# Patient Record
Sex: Female | Born: 1960 | Race: White | Hispanic: No | Marital: Married | Smoking: Current every day smoker
Health system: Southern US, Community
[De-identification: ages and names within clinical notes are randomized; demographics above are authoritative.]

## PROBLEM LIST (undated history)

## (undated) DIAGNOSIS — K835 Biliary cyst: Secondary | ICD-10-CM

## (undated) DIAGNOSIS — F329 Major depressive disorder, single episode, unspecified: Secondary | ICD-10-CM

## (undated) DIAGNOSIS — R569 Unspecified convulsions: Secondary | ICD-10-CM

## (undated) DIAGNOSIS — M1990 Unspecified osteoarthritis, unspecified site: Secondary | ICD-10-CM

## (undated) DIAGNOSIS — F32A Depression, unspecified: Secondary | ICD-10-CM

## (undated) DIAGNOSIS — R768 Other specified abnormal immunological findings in serum: Secondary | ICD-10-CM

## (undated) DIAGNOSIS — F419 Anxiety disorder, unspecified: Secondary | ICD-10-CM

## (undated) DIAGNOSIS — B1920 Unspecified viral hepatitis C without hepatic coma: Secondary | ICD-10-CM

## (undated) DIAGNOSIS — K7460 Unspecified cirrhosis of liver: Secondary | ICD-10-CM

## (undated) DIAGNOSIS — K834 Spasm of sphincter of Oddi: Secondary | ICD-10-CM

## (undated) HISTORY — PX: CHOLECYSTECTOMY: SHX55

## (undated) HISTORY — PX: COLONOSCOPY W/ POLYPECTOMY: SHX1380

## (undated) HISTORY — PX: TUBAL LIGATION: SHX77

---

## 2004-10-10 ENCOUNTER — Emergency Department (HOSPITAL_COMMUNITY): Admission: EM | Admit: 2004-10-10 | Discharge: 2004-10-10 | Payer: Self-pay | Admitting: Emergency Medicine

## 2005-02-11 ENCOUNTER — Ambulatory Visit: Payer: Self-pay | Admitting: Psychiatry

## 2005-02-11 ENCOUNTER — Inpatient Hospital Stay (HOSPITAL_COMMUNITY): Admission: AD | Admit: 2005-02-11 | Discharge: 2005-02-13 | Payer: Self-pay | Admitting: Psychiatry

## 2007-09-17 ENCOUNTER — Emergency Department (HOSPITAL_COMMUNITY): Admission: EM | Admit: 2007-09-17 | Discharge: 2007-09-17 | Payer: Self-pay | Admitting: Emergency Medicine

## 2008-03-23 ENCOUNTER — Emergency Department (HOSPITAL_COMMUNITY): Admission: EM | Admit: 2008-03-23 | Discharge: 2008-03-23 | Payer: Self-pay | Admitting: Emergency Medicine

## 2009-08-09 ENCOUNTER — Emergency Department (HOSPITAL_COMMUNITY): Admission: EM | Admit: 2009-08-09 | Discharge: 2009-08-09 | Payer: Self-pay | Admitting: Emergency Medicine

## 2009-10-11 ENCOUNTER — Emergency Department (HOSPITAL_COMMUNITY): Admission: EM | Admit: 2009-10-11 | Discharge: 2009-10-11 | Payer: Self-pay | Admitting: Emergency Medicine

## 2010-07-01 ENCOUNTER — Emergency Department (HOSPITAL_COMMUNITY)
Admission: EM | Admit: 2010-07-01 | Discharge: 2010-07-01 | Payer: Self-pay | Source: Home / Self Care | Admitting: Emergency Medicine

## 2010-07-15 ENCOUNTER — Emergency Department (HOSPITAL_COMMUNITY)
Admission: EM | Admit: 2010-07-15 | Discharge: 2010-07-15 | Payer: Self-pay | Source: Home / Self Care | Admitting: Emergency Medicine

## 2010-07-18 LAB — CBC
HCT: 36.1 % (ref 36.0–46.0)
Hemoglobin: 12.2 g/dL (ref 12.0–15.0)
MCH: 31.8 pg (ref 26.0–34.0)
MCHC: 33.8 g/dL (ref 30.0–36.0)
MCV: 94 fL (ref 78.0–100.0)
Platelets: 220 10*3/uL (ref 150–400)
RBC: 3.84 MIL/uL — ABNORMAL LOW (ref 3.87–5.11)
RDW: 13.7 % (ref 11.5–15.5)
WBC: 8.9 10*3/uL (ref 4.0–10.5)

## 2010-07-18 LAB — DIFFERENTIAL
Basophils Absolute: 0 10*3/uL (ref 0.0–0.1)
Basophils Relative: 0 % (ref 0–1)
Eosinophils Absolute: 0 10*3/uL (ref 0.0–0.7)
Eosinophils Relative: 0 % (ref 0–5)
Lymphocytes Relative: 18 % (ref 12–46)
Lymphs Abs: 1.6 10*3/uL (ref 0.7–4.0)
Monocytes Absolute: 0.5 10*3/uL (ref 0.1–1.0)
Monocytes Relative: 5 % (ref 3–12)
Neutro Abs: 6.8 10*3/uL (ref 1.7–7.7)
Neutrophils Relative %: 76 % (ref 43–77)

## 2010-07-18 LAB — D-DIMER, QUANTITATIVE: D-Dimer, Quant: 1.81 ug/mL-FEU — ABNORMAL HIGH (ref 0.00–0.48)

## 2010-07-18 LAB — COMPREHENSIVE METABOLIC PANEL
ALT: 26 U/L (ref 0–35)
AST: 34 U/L (ref 0–37)
Albumin: 3.6 g/dL (ref 3.5–5.2)
Alkaline Phosphatase: 70 U/L (ref 39–117)
BUN: 5 mg/dL — ABNORMAL LOW (ref 6–23)
CO2: 24 mEq/L (ref 19–32)
Calcium: 9.4 mg/dL (ref 8.4–10.5)
Chloride: 105 mEq/L (ref 96–112)
Creatinine, Ser: 0.63 mg/dL (ref 0.4–1.2)
GFR calc Af Amer: >60 mL/min (ref >60)
GFR calc non Af Amer: >60 mL/min (ref >60)
Glucose, Bld: 108 mg/dL — ABNORMAL HIGH (ref 70–99)
Potassium: 4 mEq/L (ref 3.5–5.1)
Sodium: 136 mEq/L (ref 135–145)
Total Bilirubin: 0.4 mg/dL (ref 0.3–1.2)
Total Protein: 7.6 g/dL (ref 6.0–8.3)

## 2010-07-18 LAB — POCT CARDIAC MARKERS
CKMB, poc: <1 ng/mL — ABNORMAL LOW (ref 1.0–8.0)
Myoglobin, poc: 33.7 ng/mL (ref 12–200)
Troponin i, poc: <0.05 ng/mL (ref 0.00–0.09)

## 2010-09-12 LAB — DIFFERENTIAL
Basophils Absolute: 0 10*3/uL (ref 0.0–0.1)
Basophils Relative: 0 % (ref 0–1)
Eosinophils Absolute: 0.1 10*3/uL (ref 0.0–0.7)
Eosinophils Relative: 1 % (ref 0–5)
Lymphocytes Relative: 21 % (ref 12–46)
Lymphs Abs: 2.3 10*3/uL (ref 0.7–4.0)
Monocytes Absolute: 0.9 10*3/uL (ref 0.1–1.0)
Monocytes Relative: 8 % (ref 3–12)
Neutro Abs: 7.6 10*3/uL (ref 1.7–7.7)
Neutrophils Relative %: 70 % (ref 43–77)

## 2010-09-12 LAB — URINALYSIS, ROUTINE W REFLEX MICROSCOPIC
Bilirubin Urine: NEGATIVE
Glucose, UA: NEGATIVE mg/dL
Hgb urine dipstick: NEGATIVE
Ketones, ur: NEGATIVE mg/dL
Nitrite: NEGATIVE
Protein, ur: NEGATIVE mg/dL
Specific Gravity, Urine: <1.005 — ABNORMAL LOW (ref 1.005–1.030)
Urobilinogen, UA: 0.2 mg/dL (ref 0.0–1.0)
pH: 5.5 (ref 5.0–8.0)

## 2010-09-12 LAB — CBC
HCT: 33.9 % — ABNORMAL LOW (ref 36.0–46.0)
Hemoglobin: 11.8 g/dL — ABNORMAL LOW (ref 12.0–15.0)
MCH: 32.2 pg (ref 26.0–34.0)
MCHC: 34.8 g/dL (ref 30.0–36.0)
MCV: 92.4 fL (ref 78.0–100.0)
Platelets: 114 10*3/uL — ABNORMAL LOW (ref 150–400)
RBC: 3.67 MIL/uL — ABNORMAL LOW (ref 3.87–5.11)
RDW: 13.8 % (ref 11.5–15.5)
WBC: 11 10*3/uL — ABNORMAL HIGH (ref 4.0–10.5)

## 2010-09-12 LAB — BASIC METABOLIC PANEL
BUN: 11 mg/dL (ref 6–23)
CO2: 23 mEq/L (ref 19–32)
Calcium: 8.9 mg/dL (ref 8.4–10.5)
Chloride: 94 mEq/L — ABNORMAL LOW (ref 96–112)
Creatinine, Ser: 0.66 mg/dL (ref 0.4–1.2)
GFR calc Af Amer: >60 mL/min (ref >60)
GFR calc non Af Amer: >60 mL/min (ref >60)
Glucose, Bld: 98 mg/dL (ref 70–99)
Potassium: 3.4 mEq/L — ABNORMAL LOW (ref 3.5–5.1)
Sodium: 127 mEq/L — ABNORMAL LOW (ref 135–145)

## 2010-11-18 NOTE — Discharge Summary (Signed)
Haley Mathis, Haley Mathis             ACCOUNT NO.:  [id number]   MEDICAL RECORD NO.:  [id number]          PATIENT TYPE:  IPS   LOCATION:  0502                          FACILITY:  [facility]   PHYSICIAN:  Syed T. Arfeen, M.D.   DATE OF BIRTH:  Apr 01, 1961   DATE OF ADMISSION:  02/11/2005  DATE OF DISCHARGE:  02/13/2005                                 DISCHARGE SUMMARY   IDENTIFYING INFORMATION:  This was an involuntary admission for this 50-year-  old married white female.  Apparently the patient overdosed on her  prescribed medications.  Patient was admitted to [facility] ICU on February 10, 2005.  It was stated that she had a history of panic disorder which as been  treated for many years with Xanax 1 mg four times a day.  She also had a  grand mal seizure for which she takes Keppra.  She stated that when she woke  up in the ICU yesterday she called her mother's phone number, and she has  not done that since her mother died.  Her mother passed 1 month ago due to  kidney cancer.  Patient appears to be depressed but said that she had no  idea why she did that.  She said that she was feeling sad, and she took some  medicine to calm herself.  She was seen and noted crying a lot.   PAST PSYCHIATRIC HISTORY:  Patient was admitted to [facility] about 25 years  ago when she cut her wrist after drinking alcohol.  She has a history of  followup at [facility] for panic attacks where she has  been getting Xanax.  However, recently her Xanax has been refilled by her  primary care doctor, Dr. Leonette Monarch.   ALCOHOL AND DRUG HISTORY:  Patient has a history of smoking one pack per  day.  She reported that her husband had quit alcohol about 6 years ago.  She  continues to endorse that she uses marijuana 2-3 times a week, and sometimes  she buys extra Xanax on the street.   CURRENT MEDICATIONS:  1.  Keppra 500 mg 1 p.o. q.a.m.  2.  Xanax 1 mg p.o. q.i.d.   POSITIVE PHYSICAL FINDINGS:  The  patient has several tattoos.  Physical was  done in [facility].   MENTAL STATUS EXAM:  Patient is alert and oriented x 3.  She appears older  than her stated age, not well kempt and not well groomed.  Gait and motor  are normal.  Speech is normal rate, rhythm and tone.  Patient appears to be  depressed.  Affect labile.  Thought processes logical and goal directed.  Appears to be somewhat demanding and asking for something to relieve her  panic disorder.  Judgment and insight are okay.  Concentration and memory  are okay.  Intelligence below average.  She denies any suicidal or homicidal  thoughts.  She denies any auditory hallucinations, no visual hallucinations.   ADMISSION DIAGNOSES:  AXIS I:  Panic disorder by history.  Major depressive  disorder.  Polysubstance abuse.  Benzodiazepine dependence.  AXIS II:  Rule out personality disorder.  AXIS III:  Seizure disorder.  Degenerative disk disease.  Dental caries.  AXIS IV:  Problems with primary support group, social environment,  education, occupation and housing.  AXIS V:  30.   HOSPITAL COURSE:  The patient was admitted and placed on safety precautions.  Patient was started on a small dose of Lexapro to target her depression and  anxiety.  Patient was encouraged to participate in individual, group and  milieu therapy.  Patient was closely monitored for any withdrawal from the  medication.  Patient mentioned that she was feeling depressed since her  mother died due to cancer.  She was started low-dose Librium protocol as  patient was using a moderate dose of Xanax.  Patient was also recommended to  have trazodone for sleep which she has used in the past with good response.  The next morning patient was seen by Dr. Electa Sniff who found that patient has  been doing very well.  Patient reported her mood has been fine.  She denies  any thoughts of hurting herself or harming someone else.  She stated that  she was ready to go home.   Discharge plan was discussed in detail.  Patient  appeared to be in a good mood with bright affect.  Her thought processes  were logical and goal directed.  She denies any auditory hallucinations,  suicidal ideation or homicidal ideation.  She felt better with medication  and with increased coping and social skills.  She reported no side effects  with the medication, though she did finish the whole detox protocol.   DISCHARGE DISPOSITION:  Patient was discharged to see Dr. Braulio Conte at  [facility].  Discharge appointment was given for February 20, 2005 at 1:00 p.m., phone [phone].   DISCHARGE MEDICATIONS:  1.  Lexapro 10 mg daily.  2.  Keppra 500 mg twice daily.  3.  Trazodone 100 mg p.o. q.h.s. p.r.n.   DISCHARGE DIAGNOSES:  AXIS I:  Panic disorder by history.  Major depressive  disorder.  Benzodiazepine dependence.  AXIS II:  Rule out personality disorder.  AXIS III:  Seizure disorder.  Degenerative disk disease.  AXIS IV:  Moderate.  AXIS V:  70.   Patient reported no anxiety, withdrawal, tremors or hand shakes and felt  ready to be discharged.      Syed T. Lolly Mustache, M.D.  Electronically Signed     STA/MEDQ  D:  02/24/2005  T:  02/24/2005  Job:  [id number]

## 2010-11-18 NOTE — H&P (Signed)
NAMEMarland Kitchen  Haley Mathis, Haley Mathis NO.:  [id number]   MEDICAL RECORD NO.:  [id number]          PATIENT TYPE:  IPS   LOCATION:  0502                          FACILITY:  [facility]   PHYSICIAN:  Jeanice Lim, M.D. DATE OF BIRTH:  06-18-1961   DATE OF ADMISSION:  02/11/2005  DATE OF DISCHARGE:                         PSYCHIATRIC ADMISSION ASSESSMENT   This is an involuntary admission to the services of Dr. Aleatha Borer.   IDENTIFYING INFORMATION:  This is a 50 year old married white female.  Apparently she intentionally overdosed on her prescribed medications.  She  was admitted to [facility] ICU on February 10, 2005.  She states today that she  has a history for panic disorder.  She has been treated for years with Xanax  1 mg q.i.d.  She also has grand mal seizures for which she is treated with  Keppra.  She states that when she woke up in the ICU yesterday she called  her mother's phone number.  She has not done that since her mother died.  Her mother passed a month or so ago from kidney cancer.  Today she is  labile.  She is depressed.  She states I have no idea why I did that.  I  just took too many pills.  She has been crying a lot lately.   PAST PSYCHIATRIC HISTORY:  About 25 years ago she was admitted to the then  [facility].  She cut her wrists after drinking alcohol.  She states there is no  mental health in [facility].  She was followed for a time at [facility] for her panic.  She has been getting her Xanax refilled  by her primary care Dhiren Azimi, Dr. Leonette Monarch.   SOCIAL HISTORY:  She was pregnant in the 7th grade with her husband's first  cousin.  She has been married twice.  She has four children.  Her oldest is  a daughter 11, a son 89, a son 15 and her youngest daughter is 32.   FAMILY HISTORY:  Apparently her mother killed someone when the patient was  4.  They had to move from [place] to [place].  Her mother always  worked once she came  here to [employer].  The patient claims that her  mother had no psychiatric illness.   ALCOHOL AND DRUG HISTORY:  She continues to smoke 1 pack cigarettes per day.  She states that she and her husband quit alcohol about 6 years, although on  her intake it states that sometimes she uses marijuana 2-3 times a week, and  she buys extra Xanax on the street.   CURRENT MEDICATIONS:  She is currently prescribed  1.  Keppra 500 mg 1 p.o. q.a.m.  2.  Xanax 1 mg q.i.d.   DRUG ALLERGIES:  No known drug allergies.   POSITIVE PHYSICAL FINDINGS:  She has several tattoos.  Her teeth are in  visible decay.  Her TSH is normal.  The remainder of his physical is as per  her records from [facility].   MENTAL STATUS EXAM:  She is alert and oriented x 3.  Her appearance, she  appears older than her stated age.  She is not well kempt, not well groomed.  She is in hospital attire.  Her gait and motor are normal.  Her speech is a  normal rate, rhythm and tone.  She is depressed.  Her affect is labile.  Her  thought processes are clear, rational and goal-oriented.  She wants  something for her panic disorder.  Judgment and insight were intact.  Concentration and memory are intact.  Intelligence is at least average.  She  denies suicidal or homicidal ideation.  She denies auditory or visual  hallucinations.   ADMISSION DIAGNOSIS:  AXIS I:  Panic disorder by history.  Major depressive  disorder.  Polysubstance abuse.  AXIS II:  Rule out personality disorder.  AXIS III:  Seizure disorder, grand mal seizure, last 4 weeks ago.  Degenerative disk disease.  Dental caries.  AXIS IV:  Severe, problems with primary support group, problems related to  social environment, education, occupational, housing and economic problems.  AXIS V:  20.   PLAN:  Admit for safety and stabilization.  Initiate appropriate  foundational therapy for her panic disorder.  Toward that end we will start  Lexapro 10 mg p.o. daily.  We  will restart her Keppra for her seizures 500  mg 1 in the morning and 1 h.s.  We will have Xanax 1 mg p.o. p.r.n. to abort  panic attacks, but she will have to request it.      [name], P.A.-C.      Jeanice Lim, M.D.  Electronically Signed    MD/MEDQ  D:  02/12/2005  T:  02/12/2005  Job:  [id number]

## 2011-01-26 ENCOUNTER — Emergency Department (HOSPITAL_COMMUNITY)
Admission: EM | Admit: 2011-01-26 | Discharge: 2011-01-26 | Disposition: A | Discharge status: Home / Self Care | Payer: Self-pay | Attending: Emergency Medicine | Admitting: Emergency Medicine

## 2011-01-26 ENCOUNTER — Other Ambulatory Visit: Payer: Self-pay

## 2011-01-26 ENCOUNTER — Encounter: Payer: Self-pay | Admitting: *Deleted

## 2011-01-26 ENCOUNTER — Emergency Department (HOSPITAL_COMMUNITY): Payer: Self-pay

## 2011-01-26 DIAGNOSIS — M25511 Pain in right shoulder: Secondary | ICD-10-CM

## 2011-01-26 DIAGNOSIS — M1990 Unspecified osteoarthritis, unspecified site: Secondary | ICD-10-CM | POA: Insufficient documentation

## 2011-01-26 DIAGNOSIS — F341 Dysthymic disorder: Secondary | ICD-10-CM | POA: Insufficient documentation

## 2011-01-26 DIAGNOSIS — F17200 Nicotine dependence, unspecified, uncomplicated: Secondary | ICD-10-CM | POA: Insufficient documentation

## 2011-01-26 DIAGNOSIS — M25519 Pain in unspecified shoulder: Secondary | ICD-10-CM | POA: Insufficient documentation

## 2011-01-26 DIAGNOSIS — R9431 Abnormal electrocardiogram [ECG] [EKG]: Secondary | ICD-10-CM | POA: Insufficient documentation

## 2011-01-26 DIAGNOSIS — R079 Chest pain, unspecified: Secondary | ICD-10-CM | POA: Insufficient documentation

## 2011-01-26 HISTORY — DX: Anxiety disorder, unspecified: F41.9

## 2011-01-26 HISTORY — DX: Unspecified convulsions: R56.9

## 2011-01-26 HISTORY — DX: Major depressive disorder, single episode, unspecified: F32.9

## 2011-01-26 HISTORY — DX: Unspecified osteoarthritis, unspecified site: M19.90

## 2011-01-26 HISTORY — DX: Depression, unspecified: F32.A

## 2011-01-26 LAB — CARDIAC PANEL(CRET KIN+CKTOT+MB+TROPI)
CK, MB: 2.2 ng/mL (ref 0.3–4.0)
Relative Index: INVALID (ref 0.0–2.5)
Total CK: 56 U/L (ref 7–177)
Troponin I: <0.3 ng/mL (ref <0.30)

## 2011-01-26 LAB — BASIC METABOLIC PANEL
BUN: 7 mg/dL (ref 6–23)
CO2: 20 mEq/L (ref 19–32)
Calcium: 9.9 mg/dL (ref 8.4–10.5)
Chloride: 104 mEq/L (ref 96–112)
Creatinine, Ser: 0.55 mg/dL (ref 0.50–1.10)
GFR calc Af Amer: >60 mL/min (ref >60)
GFR calc non Af Amer: >60 mL/min (ref >60)
Glucose, Bld: 100 mg/dL — ABNORMAL HIGH (ref 70–99)
Potassium: 4 mEq/L (ref 3.5–5.1)
Sodium: 138 mEq/L (ref 135–145)

## 2011-01-26 LAB — CBC
HCT: 40.6 % (ref 36.0–46.0)
Hemoglobin: 13.7 g/dL (ref 12.0–15.0)
MCH: 30.7 pg (ref 26.0–34.0)
MCHC: 33.7 g/dL (ref 30.0–36.0)
MCV: 91 fL (ref 78.0–100.0)
Platelets: 172 10*3/uL (ref 150–400)
RBC: 4.46 MIL/uL (ref 3.87–5.11)
RDW: 13.2 % (ref 11.5–15.5)
WBC: 8.2 10*3/uL (ref 4.0–10.5)

## 2011-01-26 MED ORDER — MORPHINE SULFATE 4 MG/ML IJ SOLN
4.0000 mg | Freq: Once | INTRAMUSCULAR | Status: AC
  Administered 2011-01-26: 4 mg via INTRAVENOUS
  Filled 2011-01-26: qty 1

## 2011-01-26 MED ORDER — NAPROXEN 500 MG PO TABS: 500.0000 mg | ORAL_TABLET | Freq: Two times a day (BID) | ORAL | Status: AC

## 2011-01-26 MED ORDER — HYDROCODONE-ACETAMINOPHEN 5-325 MG PO TABS: 1.0000 | ORAL_TABLET | ORAL | Status: AC | PRN

## 2011-01-26 MED ORDER — KETOROLAC TROMETHAMINE 30 MG/ML IJ SOLN
30.0000 mg | Freq: Once | INTRAMUSCULAR | Status: AC
  Administered 2011-01-26: 30 mg via INTRAVENOUS
  Filled 2011-01-26: qty 1

## 2011-01-26 MED ORDER — PREDNISONE 50 MG PO TABS: 50.0000 mg | ORAL_TABLET | Freq: Every day | ORAL | Status: AC

## 2011-01-26 MED ORDER — ONDANSETRON HCL 4 MG/2ML IJ SOLN
4.0000 mg | Freq: Once | INTRAMUSCULAR | Status: AC
  Administered 2011-01-26: 4 mg via INTRAVENOUS
  Filled 2011-01-26: qty 2

## 2011-01-26 NOTE — ED Provider Notes (Signed)
History     Chief Complaint  Patient presents with  . Chest Pain   Patient is a 50 y.o. female presenting with chest pain and shoulder pain. The history is provided by the patient. No language interpreter was used.  Chest Pain Episode onset: several days ago. Chest pain occurs intermittently. The chest pain is unchanged. Associated with: nothing. The severity of the pain is mild. The quality of the pain is described as sharp. The pain does not radiate. Exacerbated by: nothing. Pertinent negatives for primary symptoms include no fever, no shortness of breath, no palpitations, no abdominal pain, no nausea and no vomiting.  Pertinent negatives for associated symptoms include no diaphoresis and no near-syncope. She tried nothing for the symptoms. Risk factors include smoking/tobacco exposure.  Her past medical history is significant for anxiety/panic attacks and seizures.    Shoulder Pain This is a chronic problem. Episode onset: 2-3 months ago. The problem occurs constantly. The problem has not changed since onset.Associated symptoms include chest pain. Pertinent negatives include no abdominal pain, no headaches and no shortness of breath. Exacerbated by: movement, use. The symptoms are relieved by nothing. She has tried nothing for the symptoms.  Patient c/o constant sharp RUE pain from right shoulder to right elbow onset 2-3 months ago and persistent since. Patient also notes intermittent sharp mid sternal chest pains onset several days ago and persistent since. Patient denies recent injury/trauma/increased use of RUE but notes history of chronic problem with right rotator cuff. Daughter also notes patient had a chest x-ray performed an unknown time ago which revealed a mass and expresses concern that patient's right shoulder pain may be due to the mass. Denies abdominal pain, SOB, HA, nausea, vomiting, numbness, tingling.  Patient seen at 12:24 PM  No PCP  Past Medical History  Diagnosis Date    . Seizures   . Osteoarthritis   . Anxiety   . Depression     Past Surgical History  Procedure Date  . Cholecystectomy   . Tubal ligation     History reviewed. No pertinent family history.  History  Substance Use Topics  . Smoking status: Current Everyday Smoker -- 1.0 packs/day  . Smokeless tobacco: Not on file  . Alcohol Use: No    OB History    Grav Para Term Preterm Abortions TAB SAB Ect Mult Living                  Review of Systems  Constitutional: Negative for fever and diaphoresis.  Respiratory: Negative for shortness of breath.   Cardiovascular: Positive for chest pain. Negative for palpitations and near-syncope.  Gastrointestinal: Negative for nausea, vomiting, abdominal pain and diarrhea.  Musculoskeletal:       Right shoulder pain.   Neurological: Positive for seizures. Negative for headaches.  All other systems reviewed and are negative.  All other systems negative except as noted in HPI.   Physical Exam  BP 149/106  Pulse 82  Temp(Src) 97.4 F (36.3 C) (Oral)  Resp 17  Ht 5\' 6"  (1.676 m)  Wt 162 lb (73.483 kg)  BMI 26.15 kg/m2  SpO2 95%  Physical Exam  Nursing note and vitals reviewed. Constitutional: She is oriented to person, place, and time. She appears well-developed and well-nourished. No distress.       Appearance consistent with age of record. Hypertensive.   HENT:  Head: Normocephalic and atraumatic.  Right Ear: External ear normal.  Left Ear: External ear normal.  Nose: Nose normal.  Eyes:  Conjunctivae are normal. No scleral icterus.  Neck: Normal range of motion. Neck supple.  Cardiovascular: Normal rate and regular rhythm.   No murmur heard. Pulmonary/Chest: Effort normal and breath sounds normal. She has no wheezes. She has no rhonchi. She has no rales. She exhibits no tenderness.  Abdominal: Soft. There is no tenderness.  Musculoskeletal: Normal range of motion.       Normal appearance of extremities. Moderate pain with  active ROM of right shoulder. Tender to palpation over right shoulder.   Neurological: She is alert and oriented to person, place, and time. No sensory deficit.  Skin: Skin is warm and dry.       Color normal  Psychiatric: She has a normal mood and affect.    ED Course  Procedures  MDM Chest x-ray, lab work, EKG, all normal. X-ray of right shoulder shows severe glenohumeral arthritis. Will Rx with pain medication, prednisone, NSAID use, referred to orthopedics. Results for orders placed during the hospital encounter of 01/26/11  CBC      Component Value Range   WBC 8.2  4.0 - 10.5 (K/uL)   RBC 4.46  3.87 - 5.11 (MIL/uL)   Hemoglobin 13.7  12.0 - 15.0 (g/dL)   HCT 16.1  09.6 - 04.5 (%)   MCV 91.0  78.0 - 100.0 (fL)   MCH 30.7  26.0 - 34.0 (pg)   MCHC 33.7  30.0 - 36.0 (g/dL)   RDW 40.9  81.1 - 91.4 (%)   Platelets 172  150 - 400 (K/uL)  BASIC METABOLIC PANEL      Component Value Range   Sodium 138  135 - 145 (mEq/L)   Potassium 4.0  3.5 - 5.1 (mEq/L)   Chloride 104  96 - 112 (mEq/L)   CO2 20  19 - 32 (mEq/L)   Glucose, Bld 100 (*) 70 - 99 (mg/dL)   BUN 7  6 - 23 (mg/dL)   Creatinine, Ser 7.82  0.50 - 1.10 (mg/dL)   Calcium 9.9  8.4 - 95.6 (mg/dL)   GFR calc non Af Amer >60  >60 (mL/min)   GFR calc Af Amer >60  >60 (mL/min)  CARDIAC PANEL(CRET KIN+CKTOT+MB+TROPI)      Component Value Range   Total CK 56  7 - 177 (U/L)   CK, MB 2.2  0.3 - 4.0 (ng/mL)   Troponin I <0.30  <0.30 (ng/mL)   Relative Index RELATIVE INDEX IS INVALID  0.0 - 2.5    Chest Portable 1 View  Dg Shoulder Right  01/26/2011  *RADIOLOGY REPORT*  Clinical Data: Right shoulder pain.  RIGHT SHOULDER - 2+ VIEW 01/26/2011:  Comparison: None.  Findings: No evidence of acute subacute fracture or dislocation. Joint space narrowing involving the glenohumeral joint with hypertrophic spurring involving the humeral head and subchondral cysts in the humeral head and glenoid.  Acromioclavicular joint intact.   Well-preserved bone mineral density.  IMPRESSION: Severe osteoarthritis involving the glenohumeral joint.  Original Report Authenticated By: Arnell Sieving, M.D.   Chest Portable 1 View  01/26/2011  *RADIOLOGY REPORT*  Clinical Data: Chest pain.  PORTABLE CHEST - 1 VIEW 01/26/2011 11:57 hours:  Comparison: Two-view chest x-ray and CT angio chest 07/15/2010. Two-view chest x-ray 07/01/2010.  Findings: Suboptimal inspiration accounts for crowded bronchovascular markings in the bases and mild atelectasis in the left base.  Emphysematous changes in the upper lobes, unchanged. Lungs otherwise clear.  Cardiac silhouette normal in size for technique and degree of inspiration.  Degenerative changes in the  right glenohumeral joint again noted.  IMPRESSION: Suboptimal inspiration accounts for mild left basilar atelectasis. No acute cardiopulmonary disease otherwise.  COPD/emphysema.  Original Report Authenticated By: Arnell Sieving, M.D.      Chart written by Clarita Crane acting as scribe for Donnetta Hutching, MD  I personally performed the services described in this documentation, which was scribed in my presence. The recorded information has been reviewed and considered. Donnetta Hutching, MD     Date: 01/26/2011  Rate: 89  Rhythm: normal sinus rhythm  QRS Axis: normal  Intervals: normal  ST/T Wave abnormalities: normal  Conduction Disutrbances:prolonged QT  Narrative Interpretation:   Old EKG Reviewed: none available

## 2011-01-26 NOTE — ED Notes (Signed)
Pt c/o sharp chest pain off and on x 2 - 3 months. Also c/o shortness of breath. Pt also has rt arm pain that is constant from rotator cuff problem.

## 2011-01-26 NOTE — ED Notes (Signed)
Pt reports sharp chest pain for the past 3 mos.  Pt states that she was here a few mos back and they found a mass on her lung.  Pt is concerned that this has gotten bigger.  nad noted

## 2011-04-28 ENCOUNTER — Encounter (HOSPITAL_COMMUNITY): Payer: Self-pay

## 2011-04-28 ENCOUNTER — Emergency Department (HOSPITAL_COMMUNITY)
Admission: EM | Admit: 2011-04-28 | Discharge: 2011-04-28 | Disposition: A | Discharge status: Home / Self Care | Payer: Self-pay | Attending: Emergency Medicine | Admitting: Emergency Medicine

## 2011-04-28 ENCOUNTER — Emergency Department (HOSPITAL_COMMUNITY): Payer: Self-pay

## 2011-04-28 DIAGNOSIS — F329 Major depressive disorder, single episode, unspecified: Secondary | ICD-10-CM | POA: Insufficient documentation

## 2011-04-28 DIAGNOSIS — R569 Unspecified convulsions: Secondary | ICD-10-CM | POA: Insufficient documentation

## 2011-04-28 DIAGNOSIS — F3289 Other specified depressive episodes: Secondary | ICD-10-CM | POA: Insufficient documentation

## 2011-04-28 DIAGNOSIS — R9389 Abnormal findings on diagnostic imaging of other specified body structures: Secondary | ICD-10-CM | POA: Insufficient documentation

## 2011-04-28 DIAGNOSIS — F411 Generalized anxiety disorder: Secondary | ICD-10-CM | POA: Insufficient documentation

## 2011-04-28 DIAGNOSIS — M25511 Pain in right shoulder: Secondary | ICD-10-CM

## 2011-04-28 DIAGNOSIS — M25519 Pain in unspecified shoulder: Secondary | ICD-10-CM | POA: Insufficient documentation

## 2011-04-28 DIAGNOSIS — F17200 Nicotine dependence, unspecified, uncomplicated: Secondary | ICD-10-CM | POA: Insufficient documentation

## 2011-04-28 DIAGNOSIS — M1990 Unspecified osteoarthritis, unspecified site: Secondary | ICD-10-CM | POA: Insufficient documentation

## 2011-04-28 MED ORDER — OXYCODONE-ACETAMINOPHEN 5-325 MG PO TABS: 1.0000 | ORAL_TABLET | ORAL | Status: AC | PRN

## 2011-04-28 MED ORDER — OXYCODONE-ACETAMINOPHEN 5-325 MG PO TABS
1.0000 | ORAL_TABLET | Freq: Once | ORAL | Status: AC
  Administered 2011-04-28: 1 via ORAL
  Filled 2011-04-28: qty 1

## 2011-04-28 NOTE — ED Notes (Addendum)
Contacted ct tech Tammy  who advised that pt would not need creatine level prior to ct scan with contrast, [facility] PA informed, additional orders given

## 2011-04-28 NOTE — ED Notes (Signed)
Pt presents with right shoulder pain. Pt states pain started "a while ago". Pt left without being seen the last time she was here. Pt states last time she was here for same symptoms. NAD at this time. Pt ambulated to triage with steady gate.

## 2011-04-28 NOTE — ED Provider Notes (Signed)
History   This chart was scribed for Joya Gaskins, MD by Clarita Crane. The patient was seen in room APFT21/APFT21 and the patient's care was started at 3:44PM.   CSN: [id number] Arrival date & time: 04/28/2011  3:07 PM   First MD Initiated Contact with Patient 04/28/11 1536      Chief Complaint  Patient presents with  . Shoulder Pain   HPI Haley Mathis is a 50 y.o. female who presents to the Emergency Department complaining of waxing and waning non-radiating moderate right shoulder pain onset several years ago and persistent since but worse over the past several days. States she has had pain within her right shoulder "since I was a kid". Patient also notes that she has "knots" within right upper arm. Denies numbness, tingling.   PCP- [facility]  Past Medical History  Diagnosis Date  . Seizures   . Osteoarthritis   . Anxiety   . Depression     Past Surgical History  Procedure Date  . Cholecystectomy   . Tubal ligation     History reviewed. No pertinent family history.  History  Substance Use Topics  . Smoking status: Current Everyday Smoker -- 1.0 packs/day  . Smokeless tobacco: Not on file  . Alcohol Use: No    OB History    Grav Para Term Preterm Abortions TAB SAB Ect Mult Living                  Review of Systems 10 Systems reviewed and are negative for acute change except as noted in the HPI.  Allergies  Review of patient's allergies indicates no known allergies.  Home Medications   Current Outpatient Rx  Name Route Sig Dispense Refill  . AMITRIPTYLINE HCL 100 MG PO TABS Oral Take 200 mg by mouth at bedtime.      . ARIPIPRAZOLE 5 MG PO TABS Oral Take 5 mg by mouth daily.      Marland Kitchen CLONAZEPAM 1 MG PO TABS Oral Take 1 mg by mouth 3 (three) times daily.      Marygrace Drought WOMENS PO Oral Take 1 tablet by mouth daily.      Marland Kitchen NAPROXEN 500 MG PO TABS Oral Take 1 tablet (500 mg total) by mouth 2 (two) times daily. 30 tablet 0    BP 137/88  Pulse 94   Temp(Src) 97.9 F (36.6 C) (Oral)  Resp 18  Ht 5\' 6"  (1.676 m)  Wt 138 lb (62.596 kg)  BMI 22.27 kg/m2  SpO2 99%  Physical Exam CONSTITUTIONAL: Well developed/well nourished HEAD AND FACE: Normocephalic/atraumatic EYES: EOMI/PERRL NECK: supple no meningeal signs CV: S1/S2 noted, no murmurs/rubs/gallops noted LUNGS: Lungs are clear to auscultation bilaterally, no apparent distress NEURO: Pt is awake/alert, moves all extremitiesx4 EXTREMITIES: pulses normal, full ROM, Pain with ROM of right shoulder. RUE neurovascularly intact distally. Right radial pulse intact. SKIN: warm, color normal PSYCH: no abnormalities of mood noted  ED Course  Procedures (including critical care time)  DIAGNOSTIC STUDIES: Oxygen Saturation is 99% on room air, normal by my interpretation.    COORDINATION OF CARE:    Labs Reviewed - No data to display Dg Shoulder Right  04/28/2011  *RADIOLOGY REPORT*  Clinical Data: Right shoulder pain chronic.  No injury.  RIGHT SHOULDER - 2+ VIEW  Comparison: 01/26/2011.  Findings: Marked right glenohumeral joint degenerative changes with surrounding sclerosis.  Right acromioclavicular joint degenerative changes.  Question right upper lobe mass.  This can be evaluated with chest x- ray  or CT.  IMPRESSION: Marked right glenohumeral joint degenerative changes with surrounding sclerosis.  Right acromioclavicular joint degenerative changes.  Question right upper lobe mass.  This can be evaluated with chest x- ray or CT.  Original Report Authenticated By: Fuller Canada, M.D.    MDM  Nursing notes reviewed and considered in documentation xrays reviewed and considered  I discussed the need for close f/u within 2 week with his PCP to eval for possible RUL mass.  She reports she is already aware of that mass.  I told her that she will need urget outpatient CT chest to r/o cancer.  She is agreeable I gave her a sling for comfort but informed to not wear daily, to be sure to  range her shoulder frequently and given outpatient referral      I personally performed the services described in this documentation, which was scribed in my presence. The recorded information has been reviewed and considered.     Joya Gaskins, MD 04/28/11 [phone]

## 2011-04-28 NOTE — ED Notes (Signed)
Pt c/o right shoulder pain "for awhile" states that she has been told that she has fx "months ago" continues to have pain,

## 2011-04-28 NOTE — ED Notes (Signed)
Ct cancelled by Dr. Bebe Shaggy, pt states that she is aware of mass on right side,

## 2012-03-12 ENCOUNTER — Emergency Department (HOSPITAL_COMMUNITY)
Admission: EM | Admit: 2012-03-12 | Discharge: 2012-03-12 | Disposition: A | Discharge status: Home / Self Care | Payer: Self-pay | Attending: Emergency Medicine | Admitting: Emergency Medicine

## 2012-03-12 ENCOUNTER — Emergency Department (HOSPITAL_COMMUNITY): Payer: Self-pay

## 2012-03-12 ENCOUNTER — Encounter (HOSPITAL_COMMUNITY): Payer: Self-pay | Admitting: *Deleted

## 2012-03-12 DIAGNOSIS — R109 Unspecified abdominal pain: Secondary | ICD-10-CM

## 2012-03-12 DIAGNOSIS — F17200 Nicotine dependence, unspecified, uncomplicated: Secondary | ICD-10-CM | POA: Insufficient documentation

## 2012-03-12 DIAGNOSIS — R197 Diarrhea, unspecified: Secondary | ICD-10-CM | POA: Insufficient documentation

## 2012-03-12 DIAGNOSIS — R112 Nausea with vomiting, unspecified: Secondary | ICD-10-CM | POA: Insufficient documentation

## 2012-03-12 DIAGNOSIS — R1013 Epigastric pain: Secondary | ICD-10-CM | POA: Insufficient documentation

## 2012-03-12 LAB — COMPREHENSIVE METABOLIC PANEL
ALT: 37 U/L — ABNORMAL HIGH (ref 0–35)
AST: 53 U/L — ABNORMAL HIGH (ref 0–37)
Albumin: 4.1 g/dL (ref 3.5–5.2)
Alkaline Phosphatase: 113 U/L (ref 39–117)
Chloride: 96 mEq/L (ref 96–112)
Potassium: 3.1 mEq/L — ABNORMAL LOW (ref 3.5–5.1)
Sodium: 132 mEq/L — ABNORMAL LOW (ref 135–145)
Total Bilirubin: 0.9 mg/dL (ref 0.3–1.2)
Total Protein: 8.6 g/dL — ABNORMAL HIGH (ref 6.0–8.3)

## 2012-03-12 LAB — URINE MICROSCOPIC-ADD ON

## 2012-03-12 LAB — URINALYSIS, ROUTINE W REFLEX MICROSCOPIC
Glucose, UA: NEGATIVE mg/dL
Ketones, ur: 40 mg/dL — AB
Specific Gravity, Urine: 1.025 (ref 1.005–1.030)
pH: 6 (ref 5.0–8.0)

## 2012-03-12 LAB — CBC WITH DIFFERENTIAL/PLATELET
Basophils Absolute: 0 10*3/uL (ref 0.0–0.1)
Basophils Relative: 0 % (ref 0–1)
Eosinophils Absolute: 0 10*3/uL (ref 0.0–0.7)
Hemoglobin: 15 g/dL (ref 12.0–15.0)
MCHC: 34.7 g/dL (ref 30.0–36.0)
Monocytes Relative: 4 % (ref 3–12)
Neutro Abs: 7.7 10*3/uL (ref 1.7–7.7)
Neutrophils Relative %: 80 % — ABNORMAL HIGH (ref 43–77)
Platelets: 216 10*3/uL (ref 150–400)
RDW: 12.8 % (ref 11.5–15.5)

## 2012-03-12 MED ORDER — HYDROMORPHONE HCL PF 1 MG/ML IJ SOLN
1.0000 mg | Freq: Once | INTRAMUSCULAR | Status: AC
  Administered 2012-03-12: 1 mg via INTRAVENOUS
  Filled 2012-03-12: qty 1

## 2012-03-12 MED ORDER — ONDANSETRON HCL 4 MG/2ML IJ SOLN
4.0000 mg | Freq: Once | INTRAMUSCULAR | Status: AC
  Administered 2012-03-12: 4 mg via INTRAVENOUS
  Filled 2012-03-12: qty 2

## 2012-03-12 MED ORDER — ONDANSETRON HCL 8 MG PO TABS: 8.0000 mg | ORAL_TABLET | ORAL | Status: AC | PRN

## 2012-03-12 MED ORDER — SODIUM CHLORIDE 0.9 % IV BOLUS (SEPSIS)
1000.0000 mL | Freq: Once | INTRAVENOUS | Status: AC
  Administered 2012-03-12: 1000 mL via INTRAVENOUS

## 2012-03-12 MED ORDER — OXYCODONE-ACETAMINOPHEN 5-325 MG PO TABS: 1.0000 | ORAL_TABLET | Freq: Four times a day (QID) | ORAL | Status: AC | PRN

## 2012-03-12 MED ORDER — IOHEXOL 300 MG/ML  SOLN: 80.0000 mL | Freq: Once | INTRAMUSCULAR | Status: DC | PRN

## 2012-03-12 MED ORDER — IOHEXOL 300 MG/ML  SOLN
100.0000 mL | Freq: Once | INTRAMUSCULAR | Status: AC | PRN
  Administered 2012-03-12: 100 mL via INTRAVENOUS

## 2012-03-12 NOTE — ED Notes (Signed)
Pt c/o n/v/d abd pain that started Friday, states that she has hx of abd issues.

## 2012-03-12 NOTE — ED Notes (Signed)
Pt requesting meds for pain and nausea.  Nausea meds given.  Explained edp would see and order pain meds.  Pt verbalized understanding.

## 2012-03-12 NOTE — ED Provider Notes (Signed)
History    This chart was scribed for Donnetta Hutching, MD, MD by Smitty Pluck. The patient was seen in room APA03 and the patient's care was started at 12:52PM.   CSN: [id number]  Arrival date & time 03/12/12  1031   First MD Initiated Contact with Patient 03/12/12 1252      Chief Complaint  Patient presents with  . Abdominal Pain  . Nausea  . Emesis  . Diarrhea    (Consider location/radiation/quality/duration/timing/severity/associated sxs/prior treatment) The history is provided by the patient. No language interpreter was used.   YAHIRA TIMBERMAN is a 51 y.o. female who presents to the Emergency Department complaining of moderate abdominal pain onset 4 days ago. Pt reports having nausea, vomiting and diarrhea. She states that currents symptoms are worse that normal abdominal pain. Pt has hx of spastic colon and polyps on colon. Denies any other current pain.   PCP Dr. Tania Ade in [place], [place]    Past Medical History  Diagnosis Date  . Seizures   . Osteoarthritis   . Anxiety   . Depression     Past Surgical History  Procedure Date  . Cholecystectomy   . Tubal ligation     No family history on file.  History  Substance Use Topics  . Smoking status: Current Everyday Smoker -- 1.0 packs/day  . Smokeless tobacco: Not on file  . Alcohol Use: No    OB History    Grav Para Term Preterm Abortions TAB SAB Ect Mult Living                  Review of Systems  All other systems reviewed and are negative.   10 Systems reviewed and all are negative for acute change except as noted in the HPI.   Allergies  Review of patient's allergies indicates no known allergies.  Home Medications  No current outpatient prescriptions on file.  BP 176/98  Pulse 97  Temp 98.1 F (36.7 C)  Resp 20  Ht 5\' 6"  (1.676 m)  Wt 138 lb (62.596 kg)  BMI 22.27 kg/m2  SpO2 100%  Physical Exam  Nursing note and vitals reviewed. Constitutional: She is oriented to person, place, and time. She  appears well-developed and well-nourished.  HENT:  Head: Normocephalic and atraumatic.  Eyes: Conjunctivae and EOM are normal. Pupils are equal, round, and reactive to light.  Neck: Normal range of motion. Neck supple.  Cardiovascular: Normal rate, regular rhythm and normal heart sounds.   Pulmonary/Chest: Effort normal and breath sounds normal.  Abdominal: Soft. Bowel sounds are normal. She exhibits no distension. There is no tenderness.  Musculoskeletal: Normal range of motion.  Neurological: She is alert and oriented to person, place, and time.  Skin: Skin is warm and dry.  Psychiatric: She has a normal mood and affect.    ED Course  Procedures (including critical care time) DIAGNOSTIC STUDIES: Oxygen Saturation is 100% on room air, normal by my interpretation.    COORDINATION OF CARE: 12:55 PM Discussed pt ED treatment with pt (CT abd, Zofran, pain medication and labs) 1:05 PM Ordered:  Medications  ondansetron (ZOFRAN) injection 4 mg (4 mg Intravenous Given 03/12/12 1202)       Labs Reviewed - No data to display No results found.   No diagnosis found.  Results for orders placed during the hospital encounter of 03/12/12  CBC WITH DIFFERENTIAL      Component Value Range   WBC 9.6  4.0 - 10.5 K/uL  RBC 4.84  3.87 - 5.11 MIL/uL   Hemoglobin 15.0  12.0 - 15.0 g/dL   HCT 16.1  09.6 - 04.5 %   MCV 89.3  78.0 - 100.0 fL   MCH 31.0  26.0 - 34.0 pg   MCHC 34.7  30.0 - 36.0 g/dL   RDW 40.9  81.1 - 91.4 %   Platelets 216  150 - 400 K/uL   Neutrophils Relative 80 (*) 43 - 77 %   Neutro Abs 7.7  1.7 - 7.7 K/uL   Lymphocytes Relative 16  12 - 46 %   Lymphs Abs 1.5  0.7 - 4.0 K/uL   Monocytes Relative 4  3 - 12 %   Monocytes Absolute 0.4  0.1 - 1.0 K/uL   Eosinophils Relative 0  0 - 5 %   Eosinophils Absolute 0.0  0.0 - 0.7 K/uL   Basophils Relative 0  0 - 1 %   Basophils Absolute 0.0  0.0 - 0.1 K/uL  COMPREHENSIVE METABOLIC PANEL      Component Value Range   Sodium  132 (*) 135 - 145 mEq/L   Potassium 3.1 (*) 3.5 - 5.1 mEq/L   Chloride 96  96 - 112 mEq/L   CO2 20  19 - 32 mEq/L   Glucose, Bld 90  70 - 99 mg/dL   BUN 18  6 - 23 mg/dL   Creatinine, Ser 7.82  0.50 - 1.10 mg/dL   Calcium 9.9  8.4 - 95.6 mg/dL   Total Protein 8.6 (*) 6.0 - 8.3 g/dL   Albumin 4.1  3.5 - 5.2 g/dL   AST 53 (*) 0 - 37 U/L   ALT 37 (*) 0 - 35 U/L   Alkaline Phosphatase 113  39 - 117 U/L   Total Bilirubin 0.9  0.3 - 1.2 mg/dL   GFR calc non Af Amer >90  >90 mL/min   GFR calc Af Amer >90  >90 mL/min  LIPASE, BLOOD      Component Value Range   Lipase 38  11 - 59 U/L  URINALYSIS, ROUTINE W REFLEX MICROSCOPIC      Component Value Range   Color, Urine YELLOW  YELLOW   APPearance CLEAR  CLEAR   Specific Gravity, Urine 1.025  1.005 - 1.030   pH 6.0  5.0 - 8.0   Glucose, UA NEGATIVE  NEGATIVE mg/dL   Hgb urine dipstick TRACE (*) NEGATIVE   Bilirubin Urine SMALL (*) NEGATIVE   Ketones, ur 40 (*) NEGATIVE mg/dL   Protein, ur NEGATIVE  NEGATIVE mg/dL   Urobilinogen, UA 1.0  0.0 - 1.0 mg/dL   Nitrite NEGATIVE  NEGATIVE   Leukocytes, UA NEGATIVE  NEGATIVE  URINE MICROSCOPIC-ADD ON      Component Value Range   Squamous Epithelial / LPF MANY (*) RARE   WBC, UA 3-6  <3 WBC/hpf   RBC / HPF 0-2  <3 RBC/hpf   Bacteria, UA MANY (*) RARE    Ct Abdomen Pelvis W Contrast  03/12/2012  *RADIOLOGY REPORT*  Clinical Data: Epigastric pain.  Question Crohn's disease. Question pancreatitis.  Nausea, vomiting.  CT ABDOMEN AND PELVIS WITH CONTRAST  Technique:  Multidetector CT imaging of the abdomen and pelvis was performed following the standard protocol during bolus administration of intravenous contrast.  Contrast: OMNIPAQUE IOHEXOL 300 MG/ML  SOLN  Comparison: None.  Findings: Subsegmental dependent atelectasis in the right lung base.  Scarring in the lingula.  Otherwise lung bases are clear. No effusions.  Heart is normal size.  Prior cholecystectomy.  There is significant dilatation  of the common bile duct.  Mild intrahepatic ductal dilatation.  Common bile duct measures up to 3.1 cm proximally.  This is dilated to the ampulla.  No visible stones.  Cannot exclude ampullary stricture or lesion.  Slight pancreatic ductal dilatation as well.  No CT evidence of pancreatitis.  Nodular contours within the liver compatible with cirrhosis. Spleen, adrenals, kidneys are unremarkable.  Probable fibroid within the uterus.  Small cysts or follicles in the left ovary.  Right ovary unremarkable.  Appendix is visualized and is normal. Bowel grossly unremarkable. No free fluid, free air, or adenopathy.  Irregular partially calcified plaque throughout the aorta and common iliac arteries. No aneurysm.  No acute bony abnormality.  IMPRESSION: Prior cholecystectomy.  Markedly dilated common bile duct up to 3.1 cm.  Recommend correlation with LFTs.  The pancreatic duct is also slightly dilated.  Cannot exclude ampullary mass or stricture. Consider further evaluation with MRCP or ERCP.  Changes of cirrhosis.   Original Report Authenticated By: Cyndie Chime, M.D.     MDM  Results of CT scan discussed with patient and husband. They understand to follow up with gastroenterologist. Will initially follow up with her primary care doctor in [place]. Medication for pain and nausea.   I personally performed the services described in this documentation, which was scribed in my presence. The recorded information has been reviewed and considered.       Donnetta Hutching, MD 03/12/12 1610

## 2012-08-31 DIAGNOSIS — K834 Spasm of sphincter of Oddi: Secondary | ICD-10-CM

## 2012-08-31 DIAGNOSIS — R768 Other specified abnormal immunological findings in serum: Secondary | ICD-10-CM

## 2012-08-31 HISTORY — DX: Other specified abnormal immunological findings in serum: R76.8

## 2012-08-31 HISTORY — DX: Spasm of sphincter of Oddi: K83.4

## 2012-09-24 ENCOUNTER — Emergency Department (HOSPITAL_COMMUNITY): Payer: Self-pay

## 2012-09-24 ENCOUNTER — Encounter (HOSPITAL_COMMUNITY): Payer: Self-pay | Admitting: *Deleted

## 2012-09-24 ENCOUNTER — Inpatient Hospital Stay (HOSPITAL_COMMUNITY)
Admission: EM | Admit: 2012-09-24 | Discharge: 2012-09-28 | DRG: 446 | Disposition: A | Discharge status: Home / Self Care | Payer: MEDICAID | Attending: Internal Medicine | Admitting: Internal Medicine

## 2012-09-24 DIAGNOSIS — Z9089 Acquired absence of other organs: Secondary | ICD-10-CM

## 2012-09-24 DIAGNOSIS — F1290 Cannabis use, unspecified, uncomplicated: Secondary | ICD-10-CM | POA: Diagnosis present

## 2012-09-24 DIAGNOSIS — K529 Noninfective gastroenteritis and colitis, unspecified: Secondary | ICD-10-CM

## 2012-09-24 DIAGNOSIS — K838 Other specified diseases of biliary tract: Principal | ICD-10-CM | POA: Diagnosis present

## 2012-09-24 DIAGNOSIS — W010XXA Fall on same level from slipping, tripping and stumbling without subsequent striking against object, initial encounter: Secondary | ICD-10-CM | POA: Diagnosis present

## 2012-09-24 DIAGNOSIS — F3289 Other specified depressive episodes: Secondary | ICD-10-CM | POA: Diagnosis present

## 2012-09-24 DIAGNOSIS — R51 Headache: Secondary | ICD-10-CM | POA: Diagnosis present

## 2012-09-24 DIAGNOSIS — S92301A Fracture of unspecified metatarsal bone(s), right foot, initial encounter for closed fracture: Secondary | ICD-10-CM

## 2012-09-24 DIAGNOSIS — S92309A Fracture of unspecified metatarsal bone(s), unspecified foot, initial encounter for closed fracture: Secondary | ICD-10-CM

## 2012-09-24 DIAGNOSIS — E860 Dehydration: Secondary | ICD-10-CM | POA: Diagnosis present

## 2012-09-24 DIAGNOSIS — Z720 Tobacco use: Secondary | ICD-10-CM | POA: Diagnosis present

## 2012-09-24 DIAGNOSIS — Z9119 Patient's noncompliance with other medical treatment and regimen: Secondary | ICD-10-CM

## 2012-09-24 DIAGNOSIS — R1110 Vomiting, unspecified: Secondary | ICD-10-CM | POA: Diagnosis present

## 2012-09-24 DIAGNOSIS — R197 Diarrhea, unspecified: Secondary | ICD-10-CM

## 2012-09-24 DIAGNOSIS — K831 Obstruction of bile duct: Secondary | ICD-10-CM | POA: Diagnosis present

## 2012-09-24 DIAGNOSIS — K835 Biliary cyst: Secondary | ICD-10-CM | POA: Diagnosis present

## 2012-09-24 DIAGNOSIS — F17200 Nicotine dependence, unspecified, uncomplicated: Secondary | ICD-10-CM | POA: Diagnosis present

## 2012-09-24 DIAGNOSIS — K7460 Unspecified cirrhosis of liver: Secondary | ICD-10-CM | POA: Diagnosis present

## 2012-09-24 DIAGNOSIS — F411 Generalized anxiety disorder: Secondary | ICD-10-CM | POA: Diagnosis present

## 2012-09-24 DIAGNOSIS — E876 Hypokalemia: Secondary | ICD-10-CM | POA: Diagnosis present

## 2012-09-24 DIAGNOSIS — R519 Headache, unspecified: Secondary | ICD-10-CM | POA: Diagnosis present

## 2012-09-24 DIAGNOSIS — B1920 Unspecified viral hepatitis C without hepatic coma: Secondary | ICD-10-CM | POA: Diagnosis present

## 2012-09-24 DIAGNOSIS — Y929 Unspecified place or not applicable: Secondary | ICD-10-CM

## 2012-09-24 DIAGNOSIS — F329 Major depressive disorder, single episode, unspecified: Secondary | ICD-10-CM | POA: Diagnosis present

## 2012-09-24 DIAGNOSIS — Z91199 Patient's noncompliance with other medical treatment and regimen due to unspecified reason: Secondary | ICD-10-CM

## 2012-09-24 DIAGNOSIS — K839 Disease of biliary tract, unspecified: Secondary | ICD-10-CM | POA: Diagnosis present

## 2012-09-24 DIAGNOSIS — M1990 Unspecified osteoarthritis, unspecified site: Secondary | ICD-10-CM | POA: Diagnosis present

## 2012-09-24 LAB — URINALYSIS, ROUTINE W REFLEX MICROSCOPIC
Glucose, UA: NEGATIVE mg/dL
Hgb urine dipstick: NEGATIVE
pH: 6 (ref 5.0–8.0)

## 2012-09-24 LAB — COMPREHENSIVE METABOLIC PANEL
AST: 49 U/L — ABNORMAL HIGH (ref 0–37)
Albumin: 4.2 g/dL (ref 3.5–5.2)
BUN: 16 mg/dL (ref 6–23)
Chloride: 96 mEq/L (ref 96–112)
Creatinine, Ser: 0.66 mg/dL (ref 0.50–1.10)
Potassium: 2.4 mEq/L — CL (ref 3.5–5.1)
Total Bilirubin: 0.3 mg/dL (ref 0.3–1.2)
Total Protein: 8.3 g/dL (ref 6.0–8.3)

## 2012-09-24 LAB — CBC WITH DIFFERENTIAL/PLATELET
Eosinophils Relative: 0 % (ref 0–5)
HCT: 42.1 % (ref 36.0–46.0)
Hemoglobin: 14 g/dL (ref 12.0–15.0)
Lymphocytes Relative: 29 % (ref 12–46)
Lymphs Abs: 2.7 10*3/uL (ref 0.7–4.0)
MCV: 92.7 fL (ref 78.0–100.0)
Monocytes Absolute: 0.6 10*3/uL (ref 0.1–1.0)
Monocytes Relative: 6 % (ref 3–12)
Neutro Abs: 6.1 10*3/uL (ref 1.7–7.7)
RDW: 14 % (ref 11.5–15.5)
WBC: 9.4 10*3/uL (ref 4.0–10.5)

## 2012-09-24 LAB — LIPASE, BLOOD: Lipase: 21 U/L (ref 11–59)

## 2012-09-24 LAB — URINE MICROSCOPIC-ADD ON

## 2012-09-24 MED ORDER — ONDANSETRON HCL 4 MG/2ML IJ SOLN
4.0000 mg | Freq: Once | INTRAMUSCULAR | Status: AC
  Administered 2012-09-24: 4 mg via INTRAVENOUS
  Filled 2012-09-24: qty 2

## 2012-09-24 MED ORDER — PROMETHAZINE HCL 25 MG/ML IJ SOLN
12.5000 mg | Freq: Once | INTRAMUSCULAR | Status: AC
  Administered 2012-09-24: 12.5 mg via INTRAVENOUS
  Filled 2012-09-24: qty 1

## 2012-09-24 MED ORDER — MORPHINE SULFATE 4 MG/ML IJ SOLN: 4.0000 mg | Freq: Once | INTRAMUSCULAR | Status: DC

## 2012-09-24 MED ORDER — LORAZEPAM 2 MG/ML IJ SOLN
1.0000 mg | Freq: Once | INTRAMUSCULAR | Status: AC
  Administered 2012-09-24: 1 mg via INTRAVENOUS
  Filled 2012-09-24: qty 1

## 2012-09-24 MED ORDER — MORPHINE SULFATE 4 MG/ML IJ SOLN
4.0000 mg | Freq: Once | INTRAMUSCULAR | Status: AC
  Administered 2012-09-24: 4 mg via INTRAVENOUS
  Filled 2012-09-24: qty 1

## 2012-09-24 MED ORDER — SODIUM CHLORIDE 0.9 % IV BOLUS (SEPSIS): 1000.0000 mL | Freq: Once | INTRAVENOUS | Status: DC

## 2012-09-24 MED ORDER — MORPHINE SULFATE 4 MG/ML IJ SOLN
8.0000 mg | Freq: Once | INTRAMUSCULAR | Status: AC
  Administered 2012-09-24: 8 mg via INTRAVENOUS
  Filled 2012-09-24: qty 2

## 2012-09-24 MED ORDER — POTASSIUM CHLORIDE 10 MEQ/100ML IV SOLN
10.0000 meq | Freq: Once | INTRAVENOUS | Status: AC
  Administered 2012-09-24: 10 meq via INTRAVENOUS
  Filled 2012-09-24: qty 100

## 2012-09-24 MED ORDER — IOHEXOL 300 MG/ML  SOLN
100.0000 mL | Freq: Once | INTRAMUSCULAR | Status: AC | PRN
  Administered 2012-09-24: 100 mL via INTRAVENOUS

## 2012-09-24 MED ORDER — POTASSIUM CHLORIDE 10 MEQ/100ML IV SOLN
10.0000 meq | Freq: Once | INTRAVENOUS | Status: AC
  Administered 2012-09-25: 10 meq via INTRAVENOUS
  Filled 2012-09-24: qty 100

## 2012-09-24 MED ORDER — GI COCKTAIL ~~LOC~~
30.0000 mL | Freq: Once | ORAL | Status: AC
  Administered 2012-09-25: 30 mL via ORAL
  Filled 2012-09-24: qty 30

## 2012-09-24 MED ORDER — ONDANSETRON HCL 4 MG/2ML IJ SOLN
4.0000 mg | Freq: Once | INTRAMUSCULAR | Status: DC
  Filled 2012-09-24: qty 2

## 2012-09-24 MED ORDER — SODIUM CHLORIDE 0.9 % IV BOLUS (SEPSIS)
1000.0000 mL | Freq: Once | INTRAVENOUS | Status: AC
  Administered 2012-09-24: 1000 mL via INTRAVENOUS

## 2012-09-24 MED ORDER — PROMETHAZINE HCL 25 MG/ML IJ SOLN
12.5000 mg | Freq: Once | INTRAMUSCULAR | Status: DC
  Filled 2012-09-24: qty 1

## 2012-09-24 MED ORDER — IOHEXOL 300 MG/ML  SOLN
50.0000 mL | Freq: Once | INTRAMUSCULAR | Status: AC | PRN
  Administered 2012-09-24: 50 mL via ORAL

## 2012-09-24 MED ORDER — POTASSIUM CHLORIDE CRYS ER 20 MEQ PO TBCR: 20.0000 meq | EXTENDED_RELEASE_TABLET | Freq: Once | ORAL | Status: DC

## 2012-09-24 MED ORDER — HYDROMORPHONE HCL PF 1 MG/ML IJ SOLN
1.0000 mg | Freq: Once | INTRAMUSCULAR | Status: AC
  Administered 2012-09-24: 1 mg via INTRAVENOUS
  Filled 2012-09-24: qty 1

## 2012-09-24 NOTE — ED Notes (Signed)
Vomiting for 3 days, diarrhea, says stools are black, but has been taking pepto bismol.  abd pain. And back pain

## 2012-09-24 NOTE — ED Notes (Signed)
Pt escorted to bathroom 

## 2012-09-24 NOTE — ED Notes (Signed)
Lab called critical potassium of 2.4. Result repeated. EDPA aware. Pt states she is still unable to void at present

## 2012-09-25 ENCOUNTER — Encounter (HOSPITAL_COMMUNITY): Payer: Self-pay | Admitting: Internal Medicine

## 2012-09-25 DIAGNOSIS — R1110 Vomiting, unspecified: Secondary | ICD-10-CM

## 2012-09-25 DIAGNOSIS — R197 Diarrhea, unspecified: Secondary | ICD-10-CM

## 2012-09-25 DIAGNOSIS — F1290 Cannabis use, unspecified, uncomplicated: Secondary | ICD-10-CM | POA: Diagnosis present

## 2012-09-25 DIAGNOSIS — K838 Other specified diseases of biliary tract: Principal | ICD-10-CM

## 2012-09-25 DIAGNOSIS — K835 Biliary cyst: Secondary | ICD-10-CM | POA: Diagnosis present

## 2012-09-25 DIAGNOSIS — S92309A Fracture of unspecified metatarsal bone(s), unspecified foot, initial encounter for closed fracture: Secondary | ICD-10-CM

## 2012-09-25 DIAGNOSIS — Z720 Tobacco use: Secondary | ICD-10-CM | POA: Diagnosis present

## 2012-09-25 DIAGNOSIS — E876 Hypokalemia: Secondary | ICD-10-CM | POA: Diagnosis present

## 2012-09-25 DIAGNOSIS — K7460 Unspecified cirrhosis of liver: Secondary | ICD-10-CM

## 2012-09-25 DIAGNOSIS — K5289 Other specified noninfective gastroenteritis and colitis: Secondary | ICD-10-CM

## 2012-09-25 DIAGNOSIS — F17200 Nicotine dependence, unspecified, uncomplicated: Secondary | ICD-10-CM

## 2012-09-25 DIAGNOSIS — R519 Headache, unspecified: Secondary | ICD-10-CM | POA: Diagnosis present

## 2012-09-25 DIAGNOSIS — R51 Headache: Secondary | ICD-10-CM

## 2012-09-25 HISTORY — DX: Biliary cyst: K83.5

## 2012-09-25 HISTORY — DX: Unspecified cirrhosis of liver: K74.60

## 2012-09-25 LAB — HEPATIC FUNCTION PANEL
ALT: 39 U/L — ABNORMAL HIGH (ref 0–35)
AST: 54 U/L — ABNORMAL HIGH (ref 0–37)
Albumin: 4.3 g/dL (ref 3.5–5.2)
Bilirubin, Direct: 0.1 mg/dL (ref 0.0–0.3)

## 2012-09-25 LAB — TSH: TSH: 0.995 u[IU]/mL (ref 0.350–4.500)

## 2012-09-25 LAB — URINE CULTURE

## 2012-09-25 LAB — BASIC METABOLIC PANEL
CO2: 28 mEq/L (ref 19–32)
Chloride: 101 mEq/L (ref 96–112)
GFR calc Af Amer: >90 mL/min (ref >90)
Potassium: 3.7 mEq/L (ref 3.5–5.1)
Sodium: 134 mEq/L — ABNORMAL LOW (ref 135–145)

## 2012-09-25 LAB — RAPID URINE DRUG SCREEN, HOSP PERFORMED
Amphetamines: NOT DETECTED
Barbiturates: NOT DETECTED
Benzodiazepines: POSITIVE — AB
Cocaine: NOT DETECTED
Tetrahydrocannabinol: POSITIVE — AB

## 2012-09-25 LAB — OCCULT BLOOD, POC DEVICE: Fecal Occult Bld: NEGATIVE

## 2012-09-25 MED ORDER — ACETAMINOPHEN 500 MG PO TABS
1000.0000 mg | ORAL_TABLET | ORAL | Status: AC
  Administered 2012-09-25: 1000 mg via ORAL
  Filled 2012-09-25: qty 2

## 2012-09-25 MED ORDER — MORPHINE SULFATE 2 MG/ML IJ SOLN
2.0000 mg | INTRAMUSCULAR | Status: DC | PRN
  Administered 2012-09-25: 4 mg via INTRAVENOUS
  Administered 2012-09-25 – 2012-09-26 (×2): 2 mg via INTRAVENOUS
  Administered 2012-09-26 – 2012-09-27 (×5): 4 mg via INTRAVENOUS
  Filled 2012-09-25 (×2): qty 1
  Filled 2012-09-25 (×6): qty 2

## 2012-09-25 MED ORDER — PANTOPRAZOLE SODIUM 40 MG IV SOLR
40.0000 mg | INTRAVENOUS | Status: DC
  Administered 2012-09-25 – 2012-09-27 (×4): 40 mg via INTRAVENOUS
  Filled 2012-09-25 (×4): qty 40

## 2012-09-25 MED ORDER — ONDANSETRON HCL 4 MG/2ML IJ SOLN: 4.0000 mg | Freq: Three times a day (TID) | INTRAMUSCULAR | Status: DC | PRN

## 2012-09-25 MED ORDER — ONDANSETRON HCL 4 MG/2ML IJ SOLN
4.0000 mg | INTRAMUSCULAR | Status: DC | PRN
  Administered 2012-09-25 – 2012-09-27 (×9): 4 mg via INTRAVENOUS
  Filled 2012-09-25 (×9): qty 2

## 2012-09-25 MED ORDER — ENOXAPARIN SODIUM 40 MG/0.4ML ~~LOC~~ SOLN
40.0000 mg | SUBCUTANEOUS | Status: DC
  Administered 2012-09-25: 40 mg via SUBCUTANEOUS
  Filled 2012-09-25: qty 0.4

## 2012-09-25 MED ORDER — POTASSIUM CHLORIDE 10 MEQ/100ML IV SOLN
10.0000 meq | INTRAVENOUS | Status: AC
  Administered 2012-09-25 (×4): 10 meq via INTRAVENOUS
  Filled 2012-09-25 (×2): qty 100

## 2012-09-25 MED ORDER — SODIUM CHLORIDE 0.9 % IJ SOLN
3.0000 mL | Freq: Two times a day (BID) | INTRAMUSCULAR | Status: DC
  Administered 2012-09-27: 3 mL via INTRAVENOUS

## 2012-09-25 MED ORDER — HYDROMORPHONE HCL PF 1 MG/ML IJ SOLN
0.5000 mg | INTRAMUSCULAR | Status: DC | PRN
  Administered 2012-09-25 (×2): 0.5 mg via INTRAVENOUS
  Filled 2012-09-25 (×3): qty 1

## 2012-09-25 MED ORDER — SODIUM CHLORIDE 0.9 % IV SOLN
INTRAVENOUS | Status: DC
  Administered 2012-09-25 – 2012-09-27 (×5): via INTRAVENOUS

## 2012-09-25 MED ORDER — LOPERAMIDE HCL 2 MG PO CAPS: 2.0000 mg | ORAL_CAPSULE | ORAL | Status: DC | PRN

## 2012-09-25 MED ORDER — LOPERAMIDE HCL 2 MG PO CAPS: 4.0000 mg | ORAL_CAPSULE | Freq: Once | ORAL | Status: DC

## 2012-09-25 MED ORDER — ACETAMINOPHEN 325 MG PO TABS
650.0000 mg | ORAL_TABLET | ORAL | Status: DC | PRN
  Administered 2012-09-26 – 2012-09-28 (×3): 650 mg via ORAL
  Filled 2012-09-25 (×3): qty 2

## 2012-09-25 MED ORDER — TRAZODONE HCL 50 MG PO TABS
50.0000 mg | ORAL_TABLET | Freq: Every evening | ORAL | Status: DC | PRN
  Administered 2012-09-26: 50 mg via ORAL
  Filled 2012-09-25: qty 1

## 2012-09-25 NOTE — Care Management Note (Unsigned)
    Page 1 of 1   09/25/2012     3:15:16 PM   CARE MANAGEMENT NOTE 09/25/2012  Patient:  Haley Mathis, Haley Mathis   Account Number:  [id number]  Date Initiated:  09/25/2012  Documentation initiated by:  Rosemary Holms  Subjective/Objective Assessment:   pt admitted from home. Lives with spouse who is at bedside. No PCP, lives in [place]. CM asked financial counselor to speak w/ pt.     Action/Plan:   Anticipated DC Date:  09/25/2012   Anticipated DC Plan:  HOME/SELF CARE  In-house referral  Financial Counselor      DC Planning Services  CM consult      Choice offered to / List presented to:             Status of service:  In process, will continue to follow Medicare Important Message given?   (If response is "NO", the following Medicare IM given date fields will be blank) Date Medicare IM given:   Date Additional Medicare IM given:    Discharge Disposition:    Per UR Regulation:    If discussed at Long Length of Stay Meetings, dates discussed:    Comments:  09/25/12 Rosemary Holms RN BSN CM

## 2012-09-25 NOTE — ED Provider Notes (Signed)
Medical screening examination/treatment/procedure(s) were performed by non-physician practitioner and as supervising physician I was immediately available for consultation/collaboration. Devoria Albe, MD, Armando Gang    Ward Givens, MD 09/25/12 [phone]

## 2012-09-25 NOTE — Progress Notes (Addendum)
Chart reviewed. Discussed with Dr. Karilyn Cota  Subjective: Complains of nausea. No vomiting. Has not eaten much. Denies diarrhea. Had a black "smear" on tissue paper while wiping. Complains of severe headache which she thinks Dilaudid may be causing. Initially reports no history of drug use, When confronted about positive urine drug screen,  she admitted to marijuana use, but "not for many months". Also reports she takes her friend's pills whenever she experiences pain. When confronted about the obvious cigarette smoke in her room, she denied smoking in her room or bathroom.   used matterObjective: Vital signs in last 24 hours: Filed Vitals:   09/25/12 0447 09/25/12 0500 09/25/12 1016 09/25/12 1351  BP: 116/66  134/78 124/74  Pulse: 76  66 78  Temp:   98.3 F (36.8 C) 96.9 F (36.1 C)  TempSrc:   Oral Axillary  Resp:   18 18  Height:      Weight:  61.689 kg (136 lb)    SpO2:   96% 95%   Weight change:   Intake/Output Summary (Last 24 hours) at 09/25/12 1707 Last data filed at 09/25/12 1300  Gross per 24 hour  Intake    760 ml  Output   2300 ml  Net  -1540 ml   General: Appears uncomfortable, clutching her head with emesis basin at her side. Asking for pain medication near continuously Lungs clear to auscultation bilaterally without wheeze rhonchi or rales Cardiovascular regular rate rhythm without murmurs gallops rubs Abdomen flat normal bowel sounds, soft, winces with the lightest palpation but no tenderness when distracted.  Extremities no clubbing cyanosis or edema  Lab Results: Basic Metabolic Panel:  Recent Labs Lab 09/24/12 1751 09/25/12 0430 09/25/12 1520  NA 138  --  134*  K 2.4*  --  3.7  CL 96  --  101  CO2 31  --  28  GLUCOSE 84  --  95  BUN 16  --  8  CREATININE 0.66  --  0.67  CALCIUM 10.2  --  8.8  MG  --  2.0  --    Liver Function Tests:  Recent Labs Lab 09/24/12 1751 09/25/12 0430  AST 49* 54*  ALT 39* 39*  ALKPHOS 85 82  BILITOT 0.3 0.3   PROT 8.3 8.4*  ALBUMIN 4.2 4.3    Recent Labs Lab 09/24/12 1751  LIPASE 21   No results found for this basename: AMMONIA,  in the last 168 hours CBC:  Recent Labs Lab 09/24/12 1751  WBC 9.4  NEUTROABS 6.1  HGB 14.0  HCT 42.1  MCV 92.7  PLT 195    Recent Labs Lab 09/25/12 0430  TSH 0.995       Component Value Date/Time   LABOPIA POSITIVE* 09/25/2012 0348   COCAINSCRNUR NONE DETECTED 09/25/2012 0348   LABBENZ POSITIVE* 09/25/2012 0348   AMPHETMU NONE DETECTED 09/25/2012 0348   THCU POSITIVE* 09/25/2012 0348   LABBARB NONE DETECTED 09/25/2012 0348    Alcohol Level: No results found for this basename: ETH,  in the last 168 hours Urinalysis:  Recent Labs Lab 09/24/12 1944  COLORURINE YELLOW  LABSPEC 1.025  PHURINE 6.0  GLUCOSEU NEGATIVE  HGBUR NEGATIVE  BILIRUBINUR NEGATIVE  KETONESUR NEGATIVE  PROTEINUR TRACE*  UROBILINOGEN 0.2  NITRITE NEGATIVE  LEUKOCYTESUR SMALL*   Micro Results: No results found for this or any previous visit (from the past 240 hour(s)). Studies/Results: Ct Abdomen Pelvis W Contrast  09/24/2012  **ADDENDUM** CREATED: 09/24/2012 23:37:00  Conclusion number four should read  hepatic cirrhosis and splenomegaly.  No change from prior.  Findings suggest chronic portal venous hypertension.  **END ADDENDUM** SIGNED BY: Andreas Newport, M.D.   09/24/2012  *RADIOLOGY REPORT*  Clinical Data: Nausea and vomiting for 3 days.  Nonspecific abdominal pain.  Back pain.  History of seizures.  CT ABDOMEN AND PELVIS WITH CONTRAST  Technique:  Multidetector CT imaging of the abdomen and pelvis was performed following the standard protocol during bolus administration of intravenous contrast.  Contrast: 50mL OMNIPAQUE IOHEXOL 300 MG/ML  SOLN, OMNIPAQUE IOHEXOL 300 MG/ML  SOLN  Comparison: 03/12/2012 CT.  Findings: Lung Bases: Mild dependent atelectasis.  Scarring in the lingula is unchanged.  Visualized portions of the heart grossly normal.  Descending  thoracic aortic atherosclerosis.  Liver:  Mild intrahepatic biliary ductal dilation.  Nodular contour of the liver compatible with hepatic cirrhosis.  No focal mass lesions.  Spleen:  Splenomegaly.  Gallbladder:  Cholecystectomy.  Common bile duct:  Massive dilation of the common bile duct remains present, but unchanged compared to prior.  Common bile duct measures 31 mm on axial imaging which is the same measurements as on the prior exam.  The dilation extends to the ampulla.  Pancreas:  Normal.  Minimally prominent pancreatic duct.  Adrenal glands:  Normal.  Kidneys:  Normal enhancement.  Normal delayed excretion of contrast.  Both ureters appear normal.  No mass lesions.  Unchanged tortuosity of the proximal right ureter.  Stomach:  Normal.  Small bowel:  Distended duodenal bulb.  No obstruction.  There is flattening of the duodenum at the level of the superior mesenteric artery, which can be associated with functional obstruction "superior mesenteric artery syndrome".  Distal small bowel is well opacified with contrast.  There is no obstruction.  Colon:   Normal appendix. Ascending colon appears normal.  Normal colonic opacification with contrast.  Transverse and descending colon appear normal.  Rectosigmoid normal.  Pelvic Genitourinary:  Physiologic appearance of the uterus and adnexa.  No free fluid.  Urinary bladder normal.  Bones:  No aggressive osseous lesions.  Lumbar scoliosis and spondylosis.  Vasculature: Aortic atherosclerosis extending into the iliac arteries.  No acute vascular abnormality.  IMPRESSION: 1.  No acute abnormality. 2.  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 3.  Cholecystectomy with chronic massive dilation of the common bile duct.  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  Mild intrahepatic biliary ductal dilation.  Correlation with bilirubin recommended.   Original Report  Authenticated By: Andreas Newport, M.D.    Dg Foot Complete Right  09/24/2012  *RADIOLOGY REPORT*  Clinical Data: Fall 3 weeks ago  RIGHT FOOT COMPLETE - 3+ VIEW  Comparison: None.  Findings: Three views of the right foot submitted.  There is oblique displaced fracture of the right fifth metatarsal.  IMPRESSION: Oblique displaced fracture of the right fifth metatarsal.   Original Report Authenticated By: Natasha Mead, M.D.    Scheduled Meds: . loperamide  4 mg Oral Once  . pantoprazole (PROTONIX) IV  40 mg Intravenous Q24H  . sodium chloride  3 mL Intravenous Q12H   Continuous Infusions: . sodium chloride 150 mL/hr at 09/25/12 0520   PRN Meds:.loperamide, morphine injection, ondansetron (ZOFRAN) IV, traZODone Assessment/Plan: Principal Problem:   Vomiting and diarrhea: Diarrhea has resolved, as has vomiting. However, remains nauseated with poor appetite and complaining of pain. Will change to more pain per her request. Review of the  CAT scan shows multiple chronic abnormalities. She has cirrhosis with splenomegaly and signs of portal hypertension. Massive chronic common bile duct dilatation, possibly cyst. Post cholecystectomy. Also duodenal dilatation. Patient denies ever having abused alcohol but reports that she was hospitalized in her 28s for a week in [place] with "hepatitis". Unable to provide more history. Denies IV drug use or exposure to partners with hepatitis. She also reports frequent bouts of the vomiting diarrhea and abdominal pain. History is difficult and she changes her story frequently however. I discussed the CAT scan findings with Dr. Dionicia Abler. He reviewed the films and recommends ERCP tomorrow. We will make her n.p.o. after midnight and hold Lovenox.  Active Problems:   Hypokalemia corrected   Common bile duct cyst, see above   Metatarsal fracture, subacute.   Cirrhosis, chronic based on previous CAT scans. Unclear what workup if any has ever been undertaken. Patient is unable to  provide much history unfortunately. Denies heavy alcohol use, but also initially denied having used illicit drugs, which obviously is not truthful.    Tobacco abuse: Patient is instructed not to smoke in her room. Nicotine patch was offered but she declined.   Marijuana use   Headache: Tylenol as needed. Patient thinks it is related to Dilaudid.   LOS: 1 day   Breanah Faddis L 09/25/2012, 5:07 PM  Addendum: Called by nurse to update patient's family. Patient agrees that I may share information with him. Family reports that she has a previous history of heavy alcohol use.   Crista Curb, M.D.

## 2012-09-25 NOTE — ED Provider Notes (Signed)
History     CSN: [id number]  Arrival date & time 09/24/12  1622   First MD Initiated Contact with Patient 09/24/12 1710      Chief Complaint  Patient presents with  . Emesis    (Consider location/radiation/quality/duration/timing/severity/associated sxs/prior treatment) HPI Comments: Haley Mathis is a 52 y.o. Female presenting with a 3 day history of intractable non bloody vomiting, abdominal pain and diarrhea which she describes as watery and black,  But which improved, stating her last diarrheal episode occurred yesterday.  She has taken pepto bismol without relief of symptoms.  She does have history of intermittent episodes of similar symptoms and has a history of a "spastic colon".  Her pain is constant and located in her mid upper abdomen and radiates into her left abdomen and flank.  She denies fevers and chills, chest pain and shortness of breath, dysuria and no vaginal discharge or lower abdominal pain.  She has complaint of weakness.   Additionally, patient also mentioned well into her ed visit that she tripped over a shoe in her home three weeks ago and has had persistent pain and bruising of her foot.  She has not been evaluated for this problem before today. Pain has been constant, worse with weight bearing and better at rest,  There is no radiation of pain.    The history is provided by the patient.    Past Medical History  Diagnosis Date  . Osteoarthritis   . Anxiety   . Depression   . Seizures     Past Surgical History  Procedure Laterality Date  . Tubal ligation    . Colonoscopy w/ polypectomy    . Cholecystectomy      History reviewed. No pertinent family history.  History  Substance Use Topics  . Smoking status: Current Every Day Smoker -- 1.00 packs/day  . Smokeless tobacco: Not on file  . Alcohol Use: No    OB History   Grav Para Term Preterm Abortions TAB SAB Ect Mult Living                  Review of Systems  Constitutional: Positive  for fatigue. Negative for fever.  HENT: Negative for congestion, sore throat and neck pain.   Eyes: Negative.   Respiratory: Negative for chest tightness and shortness of breath.   Cardiovascular: Negative for chest pain.  Gastrointestinal: Positive for nausea, vomiting, abdominal pain and diarrhea.  Genitourinary: Negative.   Musculoskeletal: Negative for joint swelling and arthralgias.  Skin: Negative.  Negative for rash and wound.  Neurological: Positive for weakness. Negative for dizziness, light-headedness, numbness and headaches.  Psychiatric/Behavioral: Negative.     Allergies  Review of patient's allergies indicates no known allergies.  Home Medications  No current outpatient prescriptions on file.  BP 131/77  Pulse 68  Temp(Src) 98.7 F (37.1 C) (Oral)  Resp 18  Ht 5\' 6"  (1.676 m)  Wt 136 lb (61.689 kg)  BMI 21.96 kg/m2  SpO2 100%  Physical Exam  Nursing note and vitals reviewed. Constitutional: She appears well-developed and well-nourished.  HENT:  Head: Normocephalic and atraumatic.  Eyes: Conjunctivae are normal.  Neck: Normal range of motion.  Cardiovascular: Normal rate, regular rhythm, normal heart sounds and intact distal pulses.   Pulmonary/Chest: Effort normal and breath sounds normal. She has no wheezes.  Abdominal: Soft. Bowel sounds are normal. She exhibits no mass. There is no hepatosplenomegaly. There is tenderness in the epigastric area and left upper quadrant. There is no  rigidity, no guarding and no CVA tenderness.  Musculoskeletal: Normal range of motion.  Neurological: She is alert.  Skin: Skin is warm and dry.  Psychiatric: She has a normal mood and affect.    ED Course  Procedures (including critical care time)  Labs Reviewed  COMPREHENSIVE METABOLIC PANEL - Abnormal; Notable for the following:    Potassium 2.4 (*)    AST 49 (*)    ALT 39 (*)    All other components within normal limits  URINALYSIS, ROUTINE W REFLEX MICROSCOPIC -  Abnormal; Notable for the following:    APPearance HAZY (*)    Protein, ur TRACE (*)    Leukocytes, UA SMALL (*)    All other components within normal limits  URINE MICROSCOPIC-ADD ON - Abnormal; Notable for the following:    Squamous Epithelial / LPF MANY (*)    Bacteria, UA FEW (*)    All other components within normal limits  URINE CULTURE  LIPASE, BLOOD  CBC WITH DIFFERENTIAL  OCCULT BLOOD X 1 CARD TO LAB, STOOL   Ct Abdomen Pelvis W Contrast  09/24/2012  **ADDENDUM** CREATED: 09/24/2012 23:37:00  Conclusion number 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  No change from prior.  Findings suggest chronic portal venous hypertension.  **END ADDENDUM** SIGNED BY: Andreas Newport, M.D.   09/24/2012  *RADIOLOGY REPORT*  Clinical Data: Nausea and vomiting for 3 days.  Nonspecific abdominal pain.  Back pain.  History of seizures.  CT ABDOMEN AND PELVIS WITH CONTRAST  Technique:  Multidetector CT imaging of the abdomen and pelvis was performed following the standard protocol during bolus administration of intravenous contrast.  Contrast: 50mL OMNIPAQUE IOHEXOL 300 MG/ML  SOLN, OMNIPAQUE IOHEXOL 300 MG/ML  SOLN  Comparison: 03/12/2012 CT.  Findings: Lung Bases: Mild dependent atelectasis.  Scarring in the lingula is unchanged.  Visualized portions of the heart grossly normal.  Descending thoracic aortic atherosclerosis.  Liver:  Mild intrahepatic biliary ductal dilation.  Nodular contour of the liver compatible with hepatic cirrhosis.  No focal mass lesions.  Spleen:  Splenomegaly.  Gallbladder:  Cholecystectomy.  Common bile duct:  Massive dilation of the common bile duct remains present, but unchanged compared to prior.  Common bile duct measures 31 mm on axial imaging which is the same measurements as on the prior exam.  The dilation extends to the ampulla.  Pancreas:  Normal.  Minimally prominent pancreatic duct.  Adrenal glands:  Normal.  Kidneys:  Normal enhancement.  Normal delayed  excretion of contrast.  Both ureters appear normal.  No mass lesions.  Unchanged tortuosity of the proximal right ureter.  Stomach:  Normal.  Small bowel:  Distended duodenal bulb.  No obstruction.  There is flattening of the duodenum at the level of the superior mesenteric artery, which can be associated with functional obstruction "superior mesenteric artery syndrome".  Distal small bowel is well opacified with contrast.  There is no obstruction.  Colon:   Normal appendix. Ascending colon appears normal.  Normal colonic opacification with contrast.  Transverse and descending colon appear normal.  Rectosigmoid normal.  Pelvic Genitourinary:  Physiologic appearance of the uterus and adnexa.  No free fluid.  Urinary bladder normal.  Bones:  No aggressive osseous lesions.  Lumbar scoliosis and spondylosis.  Vasculature: Aortic atherosclerosis extending into the iliac arteries.  No acute vascular abnormality.  IMPRESSION: 1.  No acute abnormality. 2.  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 3.  Cholecystectomy with chronic massive dilation of the common bile duct.  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  Mild intrahepatic biliary ductal dilation.  Correlation with bilirubin recommended.   Original Report Authenticated By: Andreas Newport, M.D.    Dg Foot Complete Right  09/24/2012  *RADIOLOGY REPORT*  Clinical Data: Fall 3 weeks ago  RIGHT FOOT COMPLETE - 3+ VIEW  Comparison: None.  Findings: Three views of the right foot submitted.  There is oblique displaced fracture of the right fifth metatarsal.  IMPRESSION: Oblique displaced fracture of the right fifth metatarsal.   Original Report Authenticated By: Natasha Mead, M.D.      1. Hypokalemia   2. Gastroenteritis   3. Metatarsal fracture, right, closed, initial encounter       MDM  Patients labs and/or radiological studies were viewed and  considered during the medical decision making and disposition process. Pt with continued epigastric pain despite multiple doses of medications,  See below.  Profound hypokalemia.  Discussed with Dr Orvan Falconer who will admit for observation and K+ replacement.  Obs orders placed.   Medications  potassium chloride 10 mEq in 100 mL IVPB (10 mEq Intravenous New Bag/Given 09/25/12 0043)  morphine 4 MG/ML injection 4 mg (4 mg Intravenous Given 09/24/12 1756)  ondansetron (ZOFRAN) injection 4 mg (4 mg Intravenous Given 09/24/12 1756)  sodium chloride 0.9 % bolus 1,000 mL (0 mLs Intravenous Stopped 09/25/12 0013)  potassium chloride 10 mEq in 100 mL IVPB (0 mEq Intravenous Stopped 09/24/12 2043)  morphine 4 MG/ML injection 8 mg (8 mg Intravenous Given 09/24/12 1925)  promethazine (PHENERGAN) injection 12.5 mg (12.5 mg Intravenous Given 09/24/12 1847)  iohexol (OMNIPAQUE) 300 MG/ML solution 50 mL (50 mLs Oral Contrast Given 09/24/12 2034)  LORazepam (ATIVAN) injection 1 mg (1 mg Intravenous Given 09/24/12 2044)  HYDROmorphone (DILAUDID) injection 1 mg (1 mg Intravenous Given 09/24/12 2136)  ondansetron (ZOFRAN) injection 4 mg (4 mg Intravenous Given 09/24/12 2142)  iohexol (OMNIPAQUE) 300 MG/ML solution 100 mL (100 mLs Intravenous Contrast Given 09/24/12 2218)  gi cocktail (Maalox,Lidocaine,Donnatal) (30 mLs Oral Given 09/25/12 0043)      Date: 09/25/2012  Rate: 65  Rhythm: normal sinus rhythm  QRS Axis: normal  Intervals: QT prolonged  ST/T Wave abnormalities: normal  Conduction Disutrbances:none  Narrative Interpretation:   Old EKG Reviewed: unchanged        Burgess Amor, PA-C 09/25/12 0103

## 2012-09-25 NOTE — H&P (Signed)
[facility] History and Physical  Haley Mathis  ZOX:[id number]  DOB: Aug 14, 1960   DOA: 09/25/2012   PCP:   No primary provider on file.   Chief Complaint:  Nausea vomiting and diarrhea for 3 days  HPI: Haley Mathis is an 52 y.o. female.   Caucasian lady with no medical insurance, and reports that although she does have chronic medical problems of she is a disorder and she is not taking any medication because she can't afford it, presented with the above symptoms. Eventually came to the emergency room because she was feeling so weak. She denies any blood in the vomit, she reports one day of black stool (yesterday)  Initially only she had these symptoms but recently other family members are started having similar symptoms. No recent hospitalizations no recent antibiotics. She denies any outpatient medications at all except a trial of Pepto-Bismol which she did not like.  Also reports that she incidentally fell during bad weather about 3 weeks ago and injured her right foot. X-ray in the emergency room reveals a fracture of the right fifth metatarsal.  Rewiew of Systems:   All systems negative except as marked bold or noted in the HPI;  Constitutional:    malaise, fever and chills. ;  Eyes:   eye pain, redness and discharge. ; Reports she has not been seeing properly since the illness ENMT:   ear pain, hoarseness, nasal congestion, sinus pressure and sore throat from vomiting. ;  Cardiovascular:    chest pain, palpitations, diaphoresis, dyspnea and peripheral edema.  Respiratory:   cough, hemoptysis, wheezing and stridor. ;  Gastrointestinal:  nausea, vomiting, diarrhea, constipation, abdominal pain, melena, blood in stool, hematemesis, jaundice and rectal bleeding. unusual weight loss..   Genitourinary:    frequency, dysuria, incontinence,flank pain and hematuria; Musculoskeletal:   back pain and neck pain.  swelling and trauma.; Pain from a right rotator cuff injury Skin: .   pruritus, rash, abrasions, bruising and skin lesion.; ulcerations Neuro:    headache, lightheadedness and neck stiffness.  weakness, altered level of consciousness, altered mental status, extremity weakness, burning feet, involuntary movement, seizure and syncope.  Psych:    anxiety, depression, insomnia, tearfulness, panic attacks, hallucinations, paranoia, suicidal or homicidal ideation.   Past Medical History  Diagnosis Date  . Osteoarthritis   . Anxiety   . Depression   . Seizures     Past Surgical History  Procedure Laterality Date  . Tubal ligation    . Colonoscopy w/ polypectomy    . Cholecystectomy      Medications:  HOME MEDS: Prior to Admission medications   Not on File     Allergies:  No Known Allergies  Social History:   reports that she has been smoking.  She does not have any smokeless tobacco history on file. She reports that she does not drink alcohol or use illicit drugs. She reports that she quit smoking about 2 years ago; now uses electronic cigarettes but the battery ran out while she was in the emergency room.   Family History: History reviewed. No pertinent family history. Her mother had kidney cancer and hypertension; she is unaware of any medical problems with any other family  Physical Exam: Filed Vitals:   09/24/12 1639 09/24/12 1648 09/24/12 2107 09/25/12 0113  BP:  151/78 131/77 113/76  Pulse:  74 68 69  Temp:  98.7 F (37.1 C)  98 F (36.7 C)  TempSrc:  Oral    Resp:  18 18 18  Height: 5\' 6"  (1.676 m)     Weight: 61.689 kg (136 lb)     SpO2:  100% 100% 96%   Blood pressure 113/76, pulse 69, temperature 98 F (36.7 C), temperature source Oral, resp. rate 18, height 5\' 6"  (1.676 m), weight 61.689 kg (136 lb), SpO2 96.00%.  GEN:  Pleasant middle-aged Caucasian lady lying bed in no acute distress; cooperative with exam PSYCH:  alert and oriented x4; seems a little depressed; affect is blunt. HEENT: Mucous membranes pink, dry and  anicteric; PERRLA; EOM intact; no cervical lymphadenopathy nor thyromegaly or carotid bruit; no JVD; Breasts:: Not examined CHEST WALL: No tenderness CHEST: Normal respiration, clear to auscultation bilaterally HEART: Regular rate and rhythm; no murmurs rubs or gallops BACK: No kyphosis no scoliosis; no CVA tenderness ABDOMEN:  soft epigastric tenderness,; no masses, no organomegaly, increase l abdominal bowel sounds; no pannus; no intertriginous candida. Rectal Exam: Not done EXTREMITIES: age-appropriate arthropathy of the hands and knees; no edema; no ulcerations. Genitalia: not examined PULSES: 2+ and symmetric SKIN: Normal hydration no rash or ulceration CNS: Cranial nerves 2-12 grossly intact no focal lateralizing neurologic deficit   Labs on Admission:  Basic Metabolic Panel:  Recent Labs Lab 09/24/12 1751  NA 138  K 2.4*  CL 96  CO2 31  GLUCOSE 84  BUN 16  CREATININE 0.66  CALCIUM 10.2   Liver Function Tests:  Recent Labs Lab 09/24/12 1751  AST 49*  ALT 39*  ALKPHOS 85  BILITOT 0.3  PROT 8.3  ALBUMIN 4.2    Recent Labs Lab 09/24/12 1751  LIPASE 21   No results found for this basename: AMMONIA,  in the last 168 hours CBC:  Recent Labs Lab 09/24/12 1751  WBC 9.4  NEUTROABS 6.1  HGB 14.0  HCT 42.1  MCV 92.7  PLT 195   Cardiac Enzymes: No results found for this basename: CKTOTAL, CKMB, CKMBINDEX, TROPONINI,  in the last 168 hours BNP: No components found with this basename: POCBNP,  D-dimer: No components found with this basename: D-DIMER,  CBG: No results found for this basename: GLUCAP,  in the last 168 hours  Radiological Exams on Admission: Ct Abdomen Pelvis W Contrast  09/24/2012  **ADDENDUM** CREATED: 09/24/2012 23:37:00  Conclusion number 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  No change from prior.  Findings suggest chronic portal venous hypertension.  **END ADDENDUM** SIGNED BY: Andreas Newport, M.D.   09/24/2012   *RADIOLOGY REPORT*  Clinical Data: Nausea and vomiting for 3 days.  Nonspecific abdominal pain.  Back pain.  History of seizures.  CT ABDOMEN AND PELVIS WITH CONTRAST  Technique:  Multidetector CT imaging of the abdomen and pelvis was performed following the standard protocol during bolus administration of intravenous contrast.  Contrast: 50mL OMNIPAQUE IOHEXOL 300 MG/ML  SOLN, OMNIPAQUE IOHEXOL 300 MG/ML  SOLN  Comparison: 03/12/2012 CT.  Findings: Lung Bases: Mild dependent atelectasis.  Scarring in the lingula is unchanged.  Visualized portions of the heart grossly normal.  Descending thoracic aortic atherosclerosis.  Liver:  Mild intrahepatic biliary ductal dilation.  Nodular contour of the liver compatible with hepatic cirrhosis.  No focal mass lesions.  Spleen:  Splenomegaly.  Gallbladder:  Cholecystectomy.  Common bile duct:  Massive dilation of the common bile duct remains present, but unchanged compared to prior.  Common bile duct measures 31 mm on axial imaging which is the same measurements as on the prior exam.  The dilation extends to the ampulla.  Pancreas:  Normal.  Minimally prominent pancreatic duct.  Adrenal glands:  Normal.  Kidneys:  Normal enhancement.  Normal delayed excretion of contrast.  Both ureters appear normal.  No mass lesions.  Unchanged tortuosity of the proximal right ureter.  Stomach:  Normal.  Small bowel:  Distended duodenal bulb.  No obstruction.  There is flattening of the duodenum at the level of the superior mesenteric artery, which can be associated with functional obstruction "superior mesenteric artery syndrome".  Distal small bowel is well opacified with contrast.  There is no obstruction.  Colon:   Normal appendix. Ascending colon appears normal.  Normal colonic opacification with contrast.  Transverse and descending colon appear normal.  Rectosigmoid normal.  Pelvic Genitourinary:  Physiologic appearance of the uterus and adnexa.  No free fluid.  Urinary bladder  normal.  Bones:  No aggressive osseous lesions.  Lumbar scoliosis and spondylosis.  Vasculature: Aortic atherosclerosis extending into the iliac arteries.  No acute vascular abnormality.  IMPRESSION: 1.  No acute abnormality. 2.  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 3.  Cholecystectomy with chronic massive dilation of the common bile duct.  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  Mild intrahepatic biliary ductal dilation.  Correlation with bilirubin recommended.   Original Report Authenticated By: Andreas Newport, M.D.    Dg Foot Complete Right  09/24/2012  *RADIOLOGY REPORT*  Clinical Data: Fall 3 weeks ago  RIGHT FOOT COMPLETE - 3+ VIEW  Comparison: None.  Findings: Three views of the right foot submitted.  There is oblique displaced fracture of the right fifth metatarsal.  IMPRESSION: Oblique displaced fracture of the right fifth metatarsal.   Original Report Authenticated By: Natasha Mead, M.D.       Assessment/Plan Present on Admission:   Gastroenteritis, with dehydration and hypokalemia Subacute metatarsal fracture Depression Noncompliance with chronic medication History of seizure  PLAN: We'll bring this lady on observation for hydration and repletion of her potassium; A postop boot for metatarsal fracture; consider l outpatient orthopedic referral Consider social work consult for discussion of insurance, he seems to feel she cannot afford any  Other plans as per orders.  Code Status: FULL CODE  Family Communication: Discussed with patient; friend's sleeping in the room with her Disposition Plan: Likely home in a day or so    Jadrien Narine Nocturnist [facility] Pager [phone]   09/25/2012, 4:03 AM

## 2012-09-25 NOTE — Progress Notes (Signed)
UR Chart Review Completed  

## 2012-09-26 DIAGNOSIS — K838 Other specified diseases of biliary tract: Secondary | ICD-10-CM

## 2012-09-26 DIAGNOSIS — R945 Abnormal results of liver function studies: Secondary | ICD-10-CM

## 2012-09-26 LAB — COMPREHENSIVE METABOLIC PANEL
ALT: 32 U/L (ref 0–35)
AST: 38 U/L — ABNORMAL HIGH (ref 0–37)
Alkaline Phosphatase: 59 U/L (ref 39–117)
CO2: 26 mEq/L (ref 19–32)
Calcium: 8.6 mg/dL (ref 8.4–10.5)
Chloride: 108 mEq/L (ref 96–112)
GFR calc non Af Amer: >90 mL/min (ref >90)
Potassium: 3.3 mEq/L — ABNORMAL LOW (ref 3.5–5.1)
Sodium: 141 mEq/L (ref 135–145)

## 2012-09-26 LAB — HEPATITIS C ANTIBODY: HCV Ab: REACTIVE — AB

## 2012-09-26 MED ORDER — CEFAZOLIN SODIUM-DEXTROSE 2-3 GM-% IV SOLR
2.0000 g | Freq: Once | INTRAVENOUS | Status: DC
  Filled 2012-09-26: qty 50

## 2012-09-26 MED ORDER — CEFAZOLIN SODIUM-DEXTROSE 2-3 GM-% IV SOLR
INTRAVENOUS | Status: AC
  Filled 2012-09-26: qty 50

## 2012-09-26 MED ORDER — POTASSIUM CHLORIDE CRYS ER 20 MEQ PO TBCR
40.0000 meq | EXTENDED_RELEASE_TABLET | Freq: Once | ORAL | Status: AC
  Administered 2012-09-26: 40 meq via ORAL
  Filled 2012-09-26: qty 2

## 2012-09-26 MED ORDER — CEFAZOLIN SODIUM-DEXTROSE 2-3 GM-% IV SOLR
2.0000 g | Freq: Once | INTRAVENOUS | Status: AC
  Administered 2012-09-27: 2 g via INTRAVENOUS
  Filled 2012-09-26: qty 50

## 2012-09-26 MED ORDER — SODIUM CHLORIDE 0.9 % IV SOLN: INTRAVENOUS | Status: DC

## 2012-09-26 NOTE — Progress Notes (Signed)
Subjective: This lady feels much the same as yesterday. She has been seen by gastroenterology, Dr. Karilyn Cota.           Physical Exam: Blood pressure 127/68, pulse 62, temperature 98.3 F (36.8 C), temperature source Oral, resp. rate 20, height 5\' 6"  (1.676 m), weight 61.689 kg (136 lb), SpO2 95.00%. She looks systemically well. She is not toxic or septic. Abdomen is soft and largely nontender. She is alert and orientated.   Investigations:  Recent Results (from the past 240 hour(s))  URINE CULTURE     Status: None   Collection Time    09/24/12  7:44 PM      Result Value Range Status   Specimen Description URINE, CLEAN CATCH   Final   Special Requests NONE   Final   Culture  Setup Time 09/24/2012 20:30   Final   Colony Count NO GROWTH   Final   Culture NO GROWTH   Final   Report Status 09/25/2012 FINAL   Final     Basic Metabolic Panel:  Recent Labs  96/04/54 1751 09/25/12 0430 09/25/12 1520 09/26/12 0455  NA 138  --  134* 141  K 2.4*  --  3.7 3.3*  CL 96  --  101 108  CO2 31  --  28 26  GLUCOSE 84  --  95 86  BUN 16  --  8 5*  CREATININE 0.66  --  0.67 0.56  CALCIUM 10.2  --  8.8 8.6  MG  --  2.0  --   --    Liver Function Tests:  Recent Labs  09/25/12 0430 09/26/12 0455  AST 54* 38*  ALT 39* 32  ALKPHOS 82 59  BILITOT 0.3 0.4  PROT 8.4* 5.9*  ALBUMIN 4.3 3.1*     CBC:  Recent Labs  09/24/12 1751  WBC 9.4  NEUTROABS 6.1  HGB 14.0  HCT 42.1  MCV 92.7  PLT 195    Ct Abdomen Pelvis W Contrast  09/24/2012  **ADDENDUM** CREATED: 09/24/2012 23:37:00  Conclusion number 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  No change from prior.  Findings suggest chronic portal venous hypertension.  **END ADDENDUM** SIGNED BY: Andreas Newport, M.D.   09/24/2012  *RADIOLOGY REPORT*  Clinical Data: Nausea and vomiting for 3 days.  Nonspecific abdominal pain.  Back pain.  History of seizures.  CT ABDOMEN AND PELVIS WITH CONTRAST  Technique:   Multidetector CT imaging of the abdomen and pelvis was performed following the standard protocol during bolus administration of intravenous contrast.  Contrast: 50mL OMNIPAQUE IOHEXOL 300 MG/ML  SOLN, OMNIPAQUE IOHEXOL 300 MG/ML  SOLN  Comparison: 03/12/2012 CT.  Findings: Lung Bases: Mild dependent atelectasis.  Scarring in the lingula is unchanged.  Visualized portions of the heart grossly normal.  Descending thoracic aortic atherosclerosis.  Liver:  Mild intrahepatic biliary ductal dilation.  Nodular contour of the liver compatible with hepatic cirrhosis.  No focal mass lesions.  Spleen:  Splenomegaly.  Gallbladder:  Cholecystectomy.  Common bile duct:  Massive dilation of the common bile duct remains present, but unchanged compared to prior.  Common bile duct measures 31 mm on axial imaging which is the same measurements as on the prior exam.  The dilation extends to the ampulla.  Pancreas:  Normal.  Minimally prominent pancreatic duct.  Adrenal glands:  Normal.  Kidneys:  Normal enhancement.  Normal delayed excretion of contrast.  Both ureters appear normal.  No mass lesions.  Unchanged  tortuosity of the proximal right ureter.  Stomach:  Normal.  Small bowel:  Distended duodenal bulb.  No obstruction.  There is flattening of the duodenum at the level of the superior mesenteric artery, which can be associated with functional obstruction "superior mesenteric artery syndrome".  Distal small bowel is well opacified with contrast.  There is no obstruction.  Colon:   Normal appendix. Ascending colon appears normal.  Normal colonic opacification with contrast.  Transverse and descending colon appear normal.  Rectosigmoid normal.  Pelvic Genitourinary:  Physiologic appearance of the uterus and adnexa.  No free fluid.  Urinary bladder normal.  Bones:  No aggressive osseous lesions.  Lumbar scoliosis and spondylosis.  Vasculature: Aortic atherosclerosis extending into the iliac arteries.  No acute vascular  abnormality.  IMPRESSION: 1.  No acute abnormality. 2.  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 3.  Cholecystectomy with chronic massive dilation of the common bile duct.  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  Mild intrahepatic biliary ductal dilation.  Correlation with bilirubin recommended.   Original Report Authenticated By: Andreas Newport, M.D.    Dg Foot Complete Right  09/24/2012  *RADIOLOGY REPORT*  Clinical Data: Fall 3 weeks ago  RIGHT FOOT COMPLETE - 3+ VIEW  Comparison: None.  Findings: Three views of the right foot submitted.  There is oblique displaced fracture of the right fifth metatarsal.  IMPRESSION: Oblique displaced fracture of the right fifth metatarsal.   Original Report Authenticated By: Natasha Mead, M.D.       Medications: I have reviewed the patient's current medications.  Impression: 1. Significantly dilated common bile duct. Unclear etiology. 2. Cirrhosis of the liver. 3. Hypokalemia with repletion that was given yesterday.      Plan: 1. For ERCP tomorrow.     LOS: 2 days   Wilson Singer Pager [phone]  09/26/2012, 12:09 PM

## 2012-09-26 NOTE — Consult Note (Signed)
Reason for Consult:Dilated common bile duct. Referring Physician: Hospitalist  ARLANDA SHIPLETT is an 52 y.o. female.  HPI: Admitted thru the ED with complaints of nausea and vomiting and diarrhea. She tells me she was having about 20 very loose stools.  Symptoms for over 10 yrs. Present symptoms started 2 days prior to coming to the ED Symptoms worsened and she presented to the ED.   Cholecystectomy in the 90s.  She has not been able to eat in 4-5 days.  She tells me she had diarrhea prior coming to the ED an took Pepto Bismol.  She says her stools were black before the Pepto Bismol.  Stools were negative for blood in the ED. No etoh in 10 yrs. She tells me she was a heavy drinker 10 yrs ago. She tells me she drank about a gallon of liquor a day.  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 She tells me she has a hx of ?hepatitis without jaundice when she lived in [place] over 20 yrs ago. 14 tattoos. Last tattoo received about 4 months ago.   Seen in the ED for similar symptoms in 03/2012. CT abdomen revealed: IMPRESSION:  Prior cholecystectomy. Markedly dilated common bile duct up to 3.1  cm. Recommend correlation with LFTs. The pancreatic duct is also  slightly dilated. Cannot exclude ampullary mass or stricture.  Consider further evaluation with MRCP or ERCP.    Past Medical History  Diagnosis Date  . Osteoarthritis   . Anxiety   . Depression   . Seizures     Past Surgical History  Procedure Laterality Date  . Tubal ligation    . Colonoscopy w/ polypectomy    . Cholecystectomy      History reviewed. No pertinent family history.  Social History:  reports that she has been smoking.  She does not have any smokeless tobacco history on file. She reports that she does not drink alcohol or use illicit drugs.  Allergies: No Known Allergies  Medications: I have reviewed the patient's current medications.  Results for orders placed during the hospital encounter of  09/24/12 (from the past 48 hour(s))  COMPREHENSIVE METABOLIC PANEL     Status: Abnormal   Collection Time    09/24/12  5:51 PM      Result Value Range   Sodium 138  135 - 145 mEq/L   Potassium 2.4 (*) 3.5 - 5.1 mEq/L   Comment: CRITICAL RESULT CALLED TO, READ BACK BY AND VERIFIED WITH:     YOUNGER,J ON 09/24/12 AT 1835 BY LOY,C   Chloride 96  96 - 112 mEq/L   CO2 31  19 - 32 mEq/L   Glucose, Bld 84  70 - 99 mg/dL   BUN 16  6 - 23 mg/dL   Creatinine, Ser 4.09  0.50 - 1.10 mg/dL   Calcium 81.1  8.4 - 91.4 mg/dL   Total Protein 8.3  6.0 - 8.3 g/dL   Albumin 4.2  3.5 - 5.2 g/dL   AST 49 (*) 0 - 37 U/L   ALT 39 (*) 0 - 35 U/L   Alkaline Phosphatase 85  39 - 117 U/L   Total Bilirubin 0.3  0.3 - 1.2 mg/dL   GFR calc non Af Amer >90  >90 mL/min   GFR calc Af Amer >90  >90 mL/min   Comment:            The eGFR has been calculated     using the  CKD EPI equation.     This calculation has not been     validated in all clinical     situations.     eGFR's persistently     <90 mL/min signify     possible Chronic Kidney Disease.  LIPASE, BLOOD     Status: None   Collection Time    09/24/12  5:51 PM      Result Value Range   Lipase 21  11 - 59 U/L  CBC WITH DIFFERENTIAL     Status: None   Collection Time    09/24/12  5:51 PM      Result Value Range   WBC 9.4  4.0 - 10.5 K/uL   RBC 4.54  3.87 - 5.11 MIL/uL   Hemoglobin 14.0  12.0 - 15.0 g/dL   HCT 95.2  84.1 - 32.4 %   MCV 92.7  78.0 - 100.0 fL   MCH 30.8  26.0 - 34.0 pg   MCHC 33.3  30.0 - 36.0 g/dL   RDW 40.1  02.7 - 25.3 %   Platelets 195  150 - 400 K/uL   Neutrophils Relative 65  43 - 77 %   Neutro Abs 6.1  1.7 - 7.7 K/uL   Lymphocytes Relative 29  12 - 46 %   Lymphs Abs 2.7  0.7 - 4.0 K/uL   Monocytes Relative 6  3 - 12 %   Monocytes Absolute 0.6  0.1 - 1.0 K/uL   Eosinophils Relative 0  0 - 5 %   Eosinophils Absolute 0.0  0.0 - 0.7 K/uL   Basophils Relative 0  0 - 1 %   Basophils Absolute 0.0  0.0 - 0.1 K/uL   URINALYSIS, ROUTINE W REFLEX MICROSCOPIC     Status: Abnormal   Collection Time    09/24/12  7:44 PM      Result Value Range   Color, Urine YELLOW  YELLOW   APPearance HAZY (*) CLEAR   Specific Gravity, Urine 1.025  1.005 - 1.030   pH 6.0  5.0 - 8.0   Glucose, UA NEGATIVE  NEGATIVE mg/dL   Hgb urine dipstick NEGATIVE  NEGATIVE   Bilirubin Urine NEGATIVE  NEGATIVE   Ketones, ur NEGATIVE  NEGATIVE mg/dL   Protein, ur TRACE (*) NEGATIVE mg/dL   Urobilinogen, UA 0.2  0.0 - 1.0 mg/dL   Nitrite NEGATIVE  NEGATIVE   Leukocytes, UA SMALL (*) NEGATIVE  URINE MICROSCOPIC-ADD ON     Status: Abnormal   Collection Time    09/24/12  7:44 PM      Result Value Range   Squamous Epithelial / LPF MANY (*) RARE   WBC, UA 11-20  <3 WBC/hpf   Bacteria, UA FEW (*) RARE  URINE CULTURE     Status: None   Collection Time    09/24/12  7:44 PM      Result Value Range   Specimen Description URINE, CLEAN CATCH     Special Requests NONE     Culture  Setup Time 09/24/2012 20:30     Colony Count NO GROWTH     Culture NO GROWTH     Report Status 09/25/2012 FINAL    OCCULT BLOOD, POC DEVICE     Status: None   Collection Time    09/24/12  8:28 PM      Result Value Range   Fecal Occult Bld NEGATIVE  NEGATIVE  URINE RAPID DRUG SCREEN (HOSP PERFORMED)     Status: Abnormal  Collection Time    09/25/12  3:48 AM      Result Value Range   Opiates POSITIVE (*) NONE DETECTED   Cocaine NONE DETECTED  NONE DETECTED   Benzodiazepines POSITIVE (*) NONE DETECTED   Amphetamines NONE DETECTED  NONE DETECTED   Tetrahydrocannabinol POSITIVE (*) NONE DETECTED   Barbiturates NONE DETECTED  NONE DETECTED   Comment:            DRUG SCREEN FOR MEDICAL PURPOSES     ONLY.  IF CONFIRMATION IS NEEDED     FOR ANY PURPOSE, NOTIFY LAB     WITHIN 5 DAYS.                LOWEST DETECTABLE LIMITS     FOR URINE DRUG SCREEN     Drug Class       Cutoff (ng/mL)     Amphetamine      1000     Barbiturate      200      Benzodiazepine   200     Tricyclics       300     Opiates          300     Cocaine          300     THC              50  HEPATIC FUNCTION PANEL     Status: Abnormal   Collection Time    09/25/12  4:30 AM      Result Value Range   Total Protein 8.4 (*) 6.0 - 8.3 g/dL   Albumin 4.3  3.5 - 5.2 g/dL   AST 54 (*) 0 - 37 U/L   ALT 39 (*) 0 - 35 U/L   Alkaline Phosphatase 82  39 - 117 U/L   Total Bilirubin 0.3  0.3 - 1.2 mg/dL   Bilirubin, Direct 0.1  0.0 - 0.3 mg/dL   Indirect Bilirubin 0.2 (*) 0.3 - 0.9 mg/dL  MAGNESIUM     Status: None   Collection Time    09/25/12  4:30 AM      Result Value Range   Magnesium 2.0  1.5 - 2.5 mg/dL  TSH     Status: None   Collection Time    09/25/12  4:30 AM      Result Value Range   TSH 0.995  0.350 - 4.500 uIU/mL  BASIC METABOLIC PANEL     Status: Abnormal   Collection Time    09/25/12  3:20 PM      Result Value Range   Sodium 134 (*) 135 - 145 mEq/L   Potassium 3.7  3.5 - 5.1 mEq/L   Comment: DELTA CHECK NOTED   Chloride 101  96 - 112 mEq/L   CO2 28  19 - 32 mEq/L   Glucose, Bld 95  70 - 99 mg/dL   BUN 8  6 - 23 mg/dL   Creatinine, Ser 9.14  0.50 - 1.10 mg/dL   Calcium 8.8  8.4 - 78.2 mg/dL   GFR calc non Af Amer >90  >90 mL/min   GFR calc Af Amer >90  >90 mL/min   Comment:            The eGFR has been calculated     using the CKD EPI equation.     This calculation has not been     validated in all clinical     situations.  eGFR's persistently     <90 mL/min signify     possible Chronic Kidney Disease.  COMPREHENSIVE METABOLIC PANEL     Status: Abnormal   Collection Time    09/26/12  4:55 AM      Result Value Range   Sodium 141  135 - 145 mEq/L   Comment: DELTA CHECK NOTED   Potassium 3.3 (*) 3.5 - 5.1 mEq/L   Chloride 108  96 - 112 mEq/L   CO2 26  19 - 32 mEq/L   Glucose, Bld 86  70 - 99 mg/dL   BUN 5 (*) 6 - 23 mg/dL   Creatinine, Ser 1.61  0.50 - 1.10 mg/dL   Calcium 8.6  8.4 - 09.6 mg/dL   Total Protein 5.9 (*) 6.0  - 8.3 g/dL   Albumin 3.1 (*) 3.5 - 5.2 g/dL   AST 38 (*) 0 - 37 U/L   ALT 32  0 - 35 U/L   Alkaline Phosphatase 59  39 - 117 U/L   Total Bilirubin 0.4  0.3 - 1.2 mg/dL   GFR calc non Af Amer >90  >90 mL/min   GFR calc Af Amer >90  >90 mL/min   Comment:            The eGFR has been calculated     using the CKD EPI equation.     This calculation has not been     validated in all clinical     situations.     eGFR's persistently     <90 mL/min signify     possible Chronic Kidney Disease.  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  No change from prior.  Findings suggest chronic portal venous hypertension.  **END ADDENDUM** SIGNED BY: Andreas Newport, M.D.   09/24/2012  *RADIOLOGY REPORT*  Clinical Data: Nausea and vomiting for 3 days.  Nonspecific abdominal pain.  Back pain.  History of seizures.  CT ABDOMEN AND PELVIS WITH CONTRAST  Technique:  Multidetector CT imaging of the abdomen and pelvis was performed following the standard protocol during bolus administration of intravenous contrast.  Contrast: 50mL OMNIPAQUE IOHEXOL 300 MG/ML  SOLN, OMNIPAQUE IOHEXOL 300 MG/ML  SOLN  Comparison: 03/12/2012 CT.  Findings: Lung Bases: Mild dependent atelectasis.  Scarring in the lingula is unchanged.  Visualized portions of the heart grossly normal.  Descending thoracic aortic atherosclerosis.  Liver:  Mild intrahepatic biliary ductal dilation.  Nodular contour of the liver compatible with hepatic cirrhosis.  No focal mass lesions.  Spleen:  Splenomegaly.  Gallbladder:  Cholecystectomy.  Common bile duct:  Massive dilation of the common bile duct remains present, but unchanged compared to prior.  Common bile duct measures 31  mm on axial imaging which is the same measurements as on the prior exam.  The dilation extends to the ampulla.  Pancreas:  Normal.  Minimally prominent pancreatic duct.  Adrenal glands:  Normal.  Kidneys:  Normal enhancement.  Normal delayed excretion of contrast.  Both ureters appear normal.  No mass lesions.  Unchanged tortuosity of the proximal right ureter.  Stomach:  Normal.  Small bowel:  Distended duodenal  bulb.  No obstruction.  There is flattening of the duodenum at the level of the superior mesenteric artery, which can be associated with functional obstruction "superior mesenteric artery syndrome".  Distal small bowel is well opacified with contrast.  There is no obstruction.  Colon:   Normal appendix. Ascending colon appears normal.  Normal colonic opacification with contrast.  Transverse and descending colon appear normal.  Rectosigmoid normal.  Pelvic Genitourinary:  Physiologic appearance of the uterus and adnexa.  No free fluid.  Urinary bladder normal.  Bones:  No aggressive osseous lesions.  Lumbar scoliosis and spondylosis.  Vasculature: Aortic atherosclerosis extending into the iliac arteries.  No acute vascular abnormality.  IMPRESSION: 1.  No acute abnormality. 2.  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 3.  Cholecystectomy with chronic massive dilation of the common bile duct.  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  Mild intrahepatic biliary ductal dilation.  Correlation with bilirubin recommended.   Original Report Authenticated By: Andreas Newport, M.D.    Dg Foot Complete Right  09/24/2012  *RADIOLOGY REPORT*  Clinical Data: Fall 3 weeks ago  RIGHT FOOT COMPLETE - 3+ VIEW  Comparison: None.  Findings: Three views of the right foot submitted.  There is oblique displaced fracture of the right fifth metatarsal.  IMPRESSION: Oblique displaced fracture of the right fifth  metatarsal.   Original Report Authenticated By: Natasha Mead, M.D.     ROS Blood pressure 127/68, pulse 62, temperature 98.3 F (36.8 C), temperature source Oral, resp. rate 20, height 5\' 6"  (1.676 m), weight 136 lb (61.689 kg), SpO2 95.00%. Physical Exam Alert and oriented. Skin warm and dry. Oral mucosa is moist.   . Sclera anicteric, conjunctivae is pink. Thyroid not enlarged. No cervical lymphadenopathy. Lungs clear. Heart regular rate and rhythm.  Abdomen is soft. Bowel sounds are positive. No hepatomegaly. No abdominal masses felt. Epigastric tenderness.   No edema to lower extremities.    Assessment/Plan: Dilated common bile duct. Cyst/tumor needs to be ruled out. Bilirubin normal. I will discuss with Dr. Karilyn Cota. Probable ERCP.    SETZER,TERRI W 09/26/2012, 8:25 AM     GI attending note; Patient interviewed and examined. Lab studies reviewed as well as CT from September 2013 and from 2 days ago. Multiple GI issues which can be summarized as below. #1. Markedly dilated CBD and CHD with relatively normal intrahepatic system. 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 Chronic epigastric pain may be secondary to biliary tract disease. If indeed she has1 choledochocyst she will need referable to bury surgeon. #2. Cirrhotic appearing liver on CT. She appears to have well preserved hepatic function and no stigmata of portal hypertension. Etiology is felt to be alcohol since she has history of excessive alcohol use for several years but none in the last 14 years. She has multiple tattoos and therefore will check her for hepatitis B and C. No suspicious lesion seen on CT but will also check alpha-fetoprotein. #3. Recurrent but sporadic spells of nausea vomiting and diarrhea appears to be a separate issue and may just be gastroenteritis. Recommendations; ERCP with sphincterotomy if indicated. Wire markers for hepatitis  B and C. as well as alpha-fetoprotein. I have reviewed patient's condition in detail with three family members including her daughter and son-in-law I have answered all their questions. I also discussed the potential risks  of ERCP. Patient is agreeable to proceeding with ERCP.

## 2012-09-27 ENCOUNTER — Inpatient Hospital Stay (HOSPITAL_COMMUNITY): Payer: Self-pay | Admitting: Anesthesiology

## 2012-09-27 ENCOUNTER — Encounter (HOSPITAL_COMMUNITY): Payer: Self-pay | Admitting: Anesthesiology

## 2012-09-27 ENCOUNTER — Inpatient Hospital Stay (HOSPITAL_COMMUNITY): Payer: Self-pay

## 2012-09-27 ENCOUNTER — Encounter (HOSPITAL_COMMUNITY): Payer: Self-pay | Admitting: *Deleted

## 2012-09-27 ENCOUNTER — Encounter (HOSPITAL_COMMUNITY)
Admission: EM | Disposition: A | Discharge status: Home / Self Care | Payer: Self-pay | Source: Home / Self Care | Attending: Internal Medicine

## 2012-09-27 HISTORY — PX: ERCP: SHX5425

## 2012-09-27 HISTORY — PX: BALLOON DILATION: SHX5330

## 2012-09-27 HISTORY — PX: SPHINCTEROTOMY: SHX5544

## 2012-09-27 LAB — COMPREHENSIVE METABOLIC PANEL
AST: 28 U/L (ref 0–37)
Albumin: 3.1 g/dL — ABNORMAL LOW (ref 3.5–5.2)
Alkaline Phosphatase: 62 U/L (ref 39–117)
BUN: 3 mg/dL — ABNORMAL LOW (ref 6–23)
CO2: 24 mEq/L (ref 19–32)
Chloride: 109 mEq/L (ref 96–112)
Potassium: 3.7 mEq/L (ref 3.5–5.1)
Total Bilirubin: 0.3 mg/dL (ref 0.3–1.2)

## 2012-09-27 SURGERY — ERCP, WITH INTERVENTION IF INDICATED
Anesthesia: General

## 2012-09-27 MED ORDER — GLUCAGON HCL (RDNA) 1 MG IJ SOLR
INTRAMUSCULAR | Status: DC | PRN
  Administered 2012-09-27: 0.25 mg via INTRAVENOUS

## 2012-09-27 MED ORDER — LORAZEPAM 2 MG/ML IJ SOLN
0.5000 mg | Freq: Once | INTRAMUSCULAR | Status: AC
  Administered 2012-09-27: 0.5 mg via INTRAVENOUS
  Filled 2012-09-27: qty 1

## 2012-09-27 MED ORDER — SUCCINYLCHOLINE CHLORIDE 20 MG/ML IJ SOLN
INTRAMUSCULAR | Status: AC
  Filled 2012-09-27: qty 1

## 2012-09-27 MED ORDER — LORAZEPAM 0.5 MG PO TABS
0.5000 mg | ORAL_TABLET | Freq: Two times a day (BID) | ORAL | Status: DC
  Administered 2012-09-27 – 2012-09-28 (×2): 0.5 mg via ORAL
  Filled 2012-09-27 (×2): qty 1

## 2012-09-27 MED ORDER — LACTATED RINGERS IV SOLN
INTRAVENOUS | Status: DC
  Administered 2012-09-27: 13:00:00 via INTRAVENOUS

## 2012-09-27 MED ORDER — CEFAZOLIN SODIUM-DEXTROSE 2-3 GM-% IV SOLR
INTRAVENOUS | Status: DC | PRN
  Administered 2012-09-27: 2 g via INTRAVENOUS

## 2012-09-27 MED ORDER — SODIUM CHLORIDE 0.9 % IV SOLN
INTRAVENOUS | Status: DC
  Administered 2012-09-27 – 2012-09-28 (×3): via INTRAVENOUS

## 2012-09-27 MED ORDER — MIDAZOLAM HCL 5 MG/5ML IJ SOLN
INTRAMUSCULAR | Status: DC | PRN
  Administered 2012-09-27: 2 mg via INTRAVENOUS

## 2012-09-27 MED ORDER — FENTANYL CITRATE 0.05 MG/ML IJ SOLN
INTRAMUSCULAR | Status: DC | PRN
  Administered 2012-09-27: 50 ug via INTRAVENOUS
  Administered 2012-09-27: 100 ug via INTRAVENOUS
  Administered 2012-09-27: 50 ug via INTRAVENOUS

## 2012-09-27 MED ORDER — LIDOCAINE HCL (CARDIAC) 10 MG/ML IV SOLN
INTRAVENOUS | Status: DC | PRN
  Administered 2012-09-27: 50 mg via INTRAVENOUS

## 2012-09-27 MED ORDER — MIDAZOLAM HCL 2 MG/2ML IJ SOLN
1.0000 mg | INTRAMUSCULAR | Status: DC | PRN
  Administered 2012-09-27: 2 mg via INTRAVENOUS

## 2012-09-27 MED ORDER — ONDANSETRON HCL 4 MG/2ML IJ SOLN: 4.0000 mg | Freq: Once | INTRAMUSCULAR | Status: AC | PRN

## 2012-09-27 MED ORDER — GLYCOPYRROLATE 0.2 MG/ML IJ SOLN
INTRAMUSCULAR | Status: AC
  Filled 2012-09-27: qty 1

## 2012-09-27 MED ORDER — FENTANYL CITRATE 0.05 MG/ML IJ SOLN
25.0000 ug | Freq: Once | INTRAMUSCULAR | Status: AC
  Administered 2012-09-27: 25 ug via INTRAVENOUS
  Filled 2012-09-27: qty 2

## 2012-09-27 MED ORDER — ONDANSETRON HCL 4 MG/2ML IJ SOLN
INTRAMUSCULAR | Status: AC
  Filled 2012-09-27: qty 2

## 2012-09-27 MED ORDER — PROPOFOL 10 MG/ML IV EMUL
INTRAVENOUS | Status: AC
  Filled 2012-09-27: qty 20

## 2012-09-27 MED ORDER — STERILE WATER FOR IRRIGATION IR SOLN
Status: DC | PRN
  Administered 2012-09-27: 14:00:00

## 2012-09-27 MED ORDER — LIDOCAINE HCL (PF) 1 % IJ SOLN
INTRAMUSCULAR | Status: AC
  Filled 2012-09-27: qty 5

## 2012-09-27 MED ORDER — FENTANYL CITRATE 0.05 MG/ML IJ SOLN
INTRAMUSCULAR | Status: AC
  Filled 2012-09-27: qty 2

## 2012-09-27 MED ORDER — CEFAZOLIN SODIUM-DEXTROSE 2-3 GM-% IV SOLR: 2.0000 g | Freq: Once | INTRAVENOUS | Status: DC

## 2012-09-27 MED ORDER — MORPHINE SULFATE 2 MG/ML IJ SOLN
2.0000 mg | INTRAMUSCULAR | Status: DC | PRN
  Administered 2012-09-27 – 2012-09-28 (×2): 2 mg via INTRAVENOUS
  Filled 2012-09-27 (×2): qty 1

## 2012-09-27 MED ORDER — CEFAZOLIN SODIUM-DEXTROSE 2-3 GM-% IV SOLR
INTRAVENOUS | Status: AC
  Filled 2012-09-27: qty 50

## 2012-09-27 MED ORDER — ALPRAZOLAM 0.5 MG PO TABS: 0.5000 mg | ORAL_TABLET | Freq: Two times a day (BID) | ORAL | Status: DC | PRN

## 2012-09-27 MED ORDER — LACTATED RINGERS IV SOLN
INTRAVENOUS | Status: DC | PRN
  Administered 2012-09-27: 13:00:00 via INTRAVENOUS

## 2012-09-27 MED ORDER — SODIUM CHLORIDE 0.9 % IV SOLN
INTRAVENOUS | Status: DC | PRN
  Administered 2012-09-27: 14:00:00

## 2012-09-27 MED ORDER — MIDAZOLAM HCL 2 MG/2ML IJ SOLN
INTRAMUSCULAR | Status: AC
  Filled 2012-09-27: qty 2

## 2012-09-27 MED ORDER — GLYCOPYRROLATE 0.2 MG/ML IJ SOLN
0.2000 mg | Freq: Once | INTRAMUSCULAR | Status: AC
  Administered 2012-09-27: 0.2 mg via INTRAVENOUS

## 2012-09-27 MED ORDER — HEPARIN SODIUM (PORCINE) 5000 UNIT/ML IJ SOLN
5000.0000 [IU] | Freq: Three times a day (TID) | INTRAMUSCULAR | Status: DC
  Administered 2012-09-27 – 2012-09-28 (×3): 5000 [IU] via SUBCUTANEOUS
  Filled 2012-09-27 (×3): qty 1

## 2012-09-27 MED ORDER — ONDANSETRON HCL 4 MG/2ML IJ SOLN
4.0000 mg | Freq: Once | INTRAMUSCULAR | Status: AC
  Administered 2012-09-27: 4 mg via INTRAVENOUS

## 2012-09-27 MED ORDER — FENTANYL CITRATE 0.05 MG/ML IJ SOLN: 25.0000 ug | INTRAMUSCULAR | Status: DC | PRN

## 2012-09-27 MED ORDER — SUCCINYLCHOLINE CHLORIDE 20 MG/ML IJ SOLN
INTRAMUSCULAR | Status: DC | PRN
  Administered 2012-09-27: 100 mg via INTRAVENOUS

## 2012-09-27 MED ORDER — MORPHINE SULFATE (CONCENTRATE) 10 MG /0.5 ML PO SOLN
10.0000 mg | ORAL | Status: DC | PRN
  Administered 2012-09-27 (×2): 10 mg via SUBLINGUAL
  Filled 2012-09-27 (×2): qty 0.5

## 2012-09-27 MED ORDER — ONDANSETRON HCL 4 MG/2ML IJ SOLN
INTRAMUSCULAR | Status: DC | PRN
  Administered 2012-09-27: 4 mg via INTRAVENOUS

## 2012-09-27 SURGICAL SUPPLY — 24 items
BAG HAMPER (MISCELLANEOUS) ×2 IMPLANT
BALLN RETRIEVAL 12X15 (BALLOONS) ×2 IMPLANT
BASKET TRAPEZOID 3X6 (MISCELLANEOUS) IMPLANT
DEVICE INFLATION ENCORE 26 (MISCELLANEOUS) IMPLANT
DEVICE LOCKING W-BIOPSY CAP (MISCELLANEOUS) ×2 IMPLANT
GUIDEWIRE HYDRA JAGWIRE .35 (WIRE) IMPLANT
GUIDEWIRE JAG HINI 025X260CM (WIRE) IMPLANT
KIT ROOM TURNOVER APOR (KITS) ×2 IMPLANT
LUBRICANT JELLY 4.5OZ STERILE (MISCELLANEOUS) ×2 IMPLANT
NEEDLE HYPO 18GX1.5 BLUNT FILL (NEEDLE) ×2 IMPLANT
PAD ARMBOARD 7.5X6 YLW CONV (MISCELLANEOUS) ×2 IMPLANT
PATHFINDER 450CM 0.18 (STENTS) IMPLANT
POSITIONER HEAD 8X9X4 ADT (SOFTGOODS) IMPLANT
SNARE ROTATE MED OVAL 20MM (MISCELLANEOUS) IMPLANT
SPHINCTEROTOME AUTOTOME .25 (MISCELLANEOUS) IMPLANT
SPHINCTEROTOME HYDRATOME 44 (MISCELLANEOUS) ×2 IMPLANT
SPONGE GAUZE 4X4 12PLY (GAUZE/BANDAGES/DRESSINGS) ×2 IMPLANT
SYR 3ML LL SCALE MARK (SYRINGE) IMPLANT
SYR 50ML LL SCALE MARK (SYRINGE) ×4 IMPLANT
SYSTEM CONTINUOUS INJECTION (MISCELLANEOUS) ×2 IMPLANT
TUBING ENDO SMARTCAP PENTAX (MISCELLANEOUS) ×2 IMPLANT
WALLSTENT METAL COVERED 10X60 (STENTS) IMPLANT
WALLSTENT METAL COVERED 10X80 (STENTS) IMPLANT
WATER STERILE IRR 1000ML POUR (IV SOLUTION) ×2 IMPLANT

## 2012-09-27 NOTE — Preoperative (Signed)
Beta Blockers   Reason not to administer Beta Blockers:Not Applicable 

## 2012-09-27 NOTE — Anesthesia Procedure Notes (Signed)
Procedure Name: Intubation Date/Time: 09/27/2012 1:35 PM Performed by: Carolyne Littles, Lexany Belknap L Pre-anesthesia Checklist: Patient identified, Patient being monitored, Timeout performed, Emergency Drugs available and Suction available Patient Re-evaluated:Patient Re-evaluated prior to inductionOxygen Delivery Method: Circle System Utilized Preoxygenation: Pre-oxygenation with 100% oxygen Intubation Type: IV induction Laryngoscope Size: 3 and Miller Grade View: Grade I Tube type: Oral Tube size: 7.0 mm Number of attempts: 1 Airway Equipment and Method: stylet Placement Confirmation: ETT inserted through vocal cords under direct vision,  positive ETCO2 and breath sounds checked- equal and bilateral Secured at: 21 cm Tube secured with: Tape Dental Injury: Teeth and Oropharynx as per pre-operative assessment

## 2012-09-27 NOTE — Anesthesia Preprocedure Evaluation (Signed)
Anesthesia Evaluation  Patient identified by MRN, date of birth, ID band Patient awake    Reviewed: Allergy & Precautions, H&P , NPO status , Patient's Chart, lab work & pertinent test results  History of Anesthesia Complications Negative for: history of anesthetic complications  Airway Mallampati: II TM Distance: >3 FB     Dental  (+) Edentulous Upper and Edentulous Lower   Pulmonary Current Smoker,  breath sounds clear to auscultation        Cardiovascular negative cardio ROS  Rhythm:Regular Rate:Normal     Neuro/Psych  Headaches, Seizures - (none 2 yrs, off meds), Well Controlled,  PSYCHIATRIC DISORDERS Anxiety Depression    GI/Hepatic   Endo/Other    Renal/GU      Musculoskeletal   Abdominal   Peds  Hematology   Anesthesia Other Findings   Reproductive/Obstetrics                           Anesthesia Physical Anesthesia Plan  ASA: II  Anesthesia Plan: General   Post-op Pain Management:    Induction: Intravenous, Rapid sequence and Cricoid pressure planned  Airway Management Planned: Oral ETT  Additional Equipment:   Intra-op Plan:   Post-operative Plan: Extubation in OR  Informed Consent: I have reviewed the patients History and Physical, chart, labs and discussed the procedure including the risks, benefits and alternatives for the proposed anesthesia with the patient or authorized representative who has indicated his/her understanding and acceptance.     Plan Discussed with:   Anesthesia Plan Comments:         Anesthesia Quick Evaluation

## 2012-09-27 NOTE — Brief Op Note (Signed)
09/24/2012 - 09/27/2012  2:09 PM  PATIENT:  Haley Mathis  52 y.o. female  PRE-OPERATIVE DIAGNOSIS:  Dilated bile duct; elevated transaminases  POST-OPERATIVE DIAGNOSIS:  Dilated bile duct; elevated transaminases  PROCEDURE:  Procedure(s): ENDOSCOPIC RETROGRADE CHOLANGIOPANCREATOGRAPHY (ERCP) (N/A) SPHINCTEROTOMY (N/A)  SURGEON:  Surgeon(s) and Role:    * Malissa Hippo, MD - Primary  Findings; Normal ampulla of water with long intramural segment. Markedly dilated CBD and CHD without filling defects. Maximal diameter greater than 30 mm. Mild dilation and intrahepatic biliary radicles. Biliary sphincterotomy performed with excellent drainage. Able to pass 15 mm balloon across sphincterotomy. Patient tolerated procedure well.

## 2012-09-27 NOTE — Transfer of Care (Signed)
Immediate Anesthesia Transfer of Care Note  Patient: Haley Mathis  Procedure(s) Performed: Procedure(s): ENDOSCOPIC RETROGRADE CHOLANGIOPANCREATOGRAPHY (ERCP) (N/A) SPHINCTEROTOMY (N/A)  Patient Location: PACU  Anesthesia Type:General  Level of Consciousness: awake, alert , oriented and patient cooperative  Airway & Oxygen Therapy: Patient connected to face mask oxygen  Post-op Assessment: Report given to PACU RN and Post -op Vital signs reviewed and stable  Post vital signs: Reviewed and stable  Complications: No apparent anesthesia complications

## 2012-09-27 NOTE — Progress Notes (Signed)
     Subjective: This lady feels much the same as yesterday. She has been seen by gastroenterology, Dr. Karilyn Cota. She is due to have ERCP today. Hepatitis C antibody is positive.           Physical Exam: Blood pressure 155/68, pulse 72, temperature 98.1 F (36.7 C), temperature source Oral, resp. rate 20, height 5\' 6"  (1.676 m), weight 61.689 kg (136 lb), SpO2 100.00%. She looks systemically well. She is not toxic or septic. Abdomen is soft and largely nontender. She is alert and orientated.   Investigations:  Recent Results (from the past 240 hour(s))  URINE CULTURE     Status: None   Collection Time    09/24/12  7:44 PM      Result Value Range Status   Specimen Description URINE, CLEAN CATCH   Final   Special Requests NONE   Final   Culture  Setup Time 09/24/2012 20:30   Final   Colony Count NO GROWTH   Final   Culture NO GROWTH   Final   Report Status 09/25/2012 FINAL   Final     Basic Metabolic Panel:  Recent Labs  13/08/[birth year] 1751 09/25/12 0430  09/26/12 0455 09/27/12 0536  NA 138  --   < > 141 140  K 2.4*  --   < > 3.3* 3.7  CL 96  --   < > 108 109  CO2 31  --   < > 26 24  GLUCOSE 84  --   < > 86 93  BUN 16  --   < > 5* 3*  CREATININE 0.66  --   < > 0.56 0.59  CALCIUM 10.2  --   < > 8.6 8.9  MG  --  2.0  --   --   --   < > = values in this interval not displayed. Liver Function Tests:  Recent Labs  09/26/12 0455 09/27/12 0536  AST 38* 28  ALT 32 26  ALKPHOS 59 62  BILITOT 0.4 0.3  PROT 5.9* 6.1  ALBUMIN 3.1* 3.1*     CBC:  Recent Labs  09/24/12 1751  WBC 9.4  NEUTROABS 6.1  HGB 14.0  HCT 42.1  MCV 92.7  PLT 195        Medications: I have reviewed the patient's current medications.  Impression: 1. Significantly dilated common bile duct. Unclear etiology. 2. Cirrhosis of the liver. 3. Hepatitis C antibody positive.       Plan: 1. For ERCP today. Gastroenterology to address hepatitis C.     LOS: 3 days    Wilson Singer Pager [phone]  09/27/2012, 9:48 AM

## 2012-09-27 NOTE — Anesthesia Postprocedure Evaluation (Addendum)
  Anesthesia Post-op Note  Patient: Haley Mathis  Procedure(s) Performed: Procedure(s): ENDOSCOPIC RETROGRADE CHOLANGIOPANCREATOGRAPHY (ERCP) (N/A) SPHINCTEROTOMY (N/A) BALLOON DILATION (N/A)  Patient Location: PACU  Anesthesia Type:General  Level of Consciousness: awake, alert , oriented and patient cooperative  Airway and Oxygen Therapy: Patient Spontanous Breathing and Patient connected to face mask oxygen  Post-op Pain: mild  Post-op Assessment: Post-op Vital signs reviewed, Patient's Cardiovascular Status Stable, Respiratory Function Stable and Patent Airway  Post-op Vital Signs: Reviewed and stable  Complications: No apparent anesthesia complications 09/28/12  Patient denies any problems associated with anesthesia.  VSS

## 2012-09-27 NOTE — Progress Notes (Signed)
Patient is still complaining of severe pain in her abdomen and her head. She states that she won't take the morphine and she won't take dilaudid because they give her a severe headache. Patient is also very anxious right now. Doctor was notified and new orders were given to give patient Fentanyl once and Ativan once, but give an hour apart. Will continue to monitor patient.

## 2012-09-28 DIAGNOSIS — F1210 Cannabis abuse, uncomplicated: Secondary | ICD-10-CM

## 2012-09-28 LAB — COMPREHENSIVE METABOLIC PANEL
BUN: <3 mg/dL — ABNORMAL LOW (ref 6–23)
CO2: 24 mEq/L (ref 19–32)
Calcium: 8.7 mg/dL (ref 8.4–10.5)
Chloride: 104 mEq/L (ref 96–112)
Creatinine, Ser: 0.56 mg/dL (ref 0.50–1.10)
GFR calc non Af Amer: >90 mL/min (ref >90)
Total Bilirubin: 0.3 mg/dL (ref 0.3–1.2)

## 2012-09-28 LAB — CBC
Hemoglobin: 10.6 g/dL — ABNORMAL LOW (ref 12.0–15.0)
MCH: 31.5 pg (ref 26.0–34.0)
MCHC: 34.1 g/dL (ref 30.0–36.0)
MCV: 92.3 fL (ref 78.0–100.0)
Platelets: 98 10*3/uL — ABNORMAL LOW (ref 150–400)
RBC: 3.37 MIL/uL — ABNORMAL LOW (ref 3.87–5.11)

## 2012-09-28 MED ORDER — LORAZEPAM 0.5 MG PO TABS: 0.5000 mg | ORAL_TABLET | Freq: Two times a day (BID) | ORAL | Status: DC

## 2012-09-28 MED ORDER — OXYCODONE-ACETAMINOPHEN 5-325 MG PO TABS
1.0000 | ORAL_TABLET | ORAL | Status: DC | PRN
Start: 2012-09-28 — End: 2013-07-15

## 2012-09-28 MED ORDER — LORAZEPAM 0.5 MG PO TABS
0.5000 mg | ORAL_TABLET | Freq: Once | ORAL | Status: AC
  Administered 2012-09-28: 0.5 mg via ORAL
  Filled 2012-09-28: qty 1

## 2012-09-28 NOTE — Discharge Summary (Signed)
Physician Discharge Summary  Haley Mathis ION:[id number] DOB: October 14, 1960 DOA: 09/24/2012  PCP: No primary provider on file.  Admit date: 09/24/2012 Discharge date: 09/28/2012  Time spent: Greater than 30 minutes  Recommendations for Outpatient Follow-up:  1. Follow with gastroenterology, Dr Karilyn Cota in approximately 4 weeks' time.  Discharge Diagnoses:  1. Markedly dilated biliary system without feeling defect with distal stricture, status post biliary sphincterotomy with excellent drainage. 2. Cirrhosis of the liver. 3. Hepatitis C antibody positive. 4. Urine drug screen positive for marijuana.   Discharge Condition: Stable.  Diet recommendation: As tolerated, regular.  Filed Weights   09/24/12 1639 09/25/12 0500 09/27/12 1230  Weight: 61.689 kg (136 lb) 61.689 kg (136 lb) 61.689 kg (136 lb)    History of present illness:  This 52 year old lady presented to the hospital with symptoms of nausea, vomiting and diarrhea for 3 days. Please see initial history as outlined below: Haley Mathis is an 52 y.o. female. Caucasian lady with no medical insurance, and reports that although she does have chronic medical problems of she is a disorder and she is not taking any medication because she can't afford it, presented with the above symptoms. Eventually came to the emergency room because she was feeling so weak. She denies any blood in the vomit, she reports one day of black stool (yesterday)  Initially only she had these symptoms but recently other family members are started having similar symptoms. No recent hospitalizations no recent antibiotics. She denies any outpatient medications at all except a trial of Pepto-Bismol which she did not like.  Also reports that she incidentally fell during bad weather about 3 weeks ago and injured her right foot. X-ray in the emergency room reveals a fracture of the right fifth metatarsal.  Hospital Course:  Patient was admitted and started on  intravenous fluids, antiemetics and analgesia as required. Imaging showed significantly dilated common bile duct. He was seen by gastroenterology, Dr. Karilyn Cota, who performed ERCP yesterday. The results of this are shown below. He will followup with the patient in 4 weeks' time to see whether she needs referral to a tertiary center. In the meantime she is going to be sent home with analgesia as required.  Procedures:  ERCP:  FINAL DIAGNOSIS: Markedly dilated biliary system without filling  defects with distal stricture.  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 Biliary sphincterotomy performed  with excellent drainage.  I suspect we maybe dealing with sphincter or Foley dysfunction rather  than type 1 choledochal cyst.  RECOMMENDATIONS: No aspirin for 3 days.  We will repeat her labs, serum amylase in a.m.  Please note, PD was not cannulated or filled with contrast.  We will also order HCV/RNA since the patient's HCV antibody is positive.  We will plan to see her back in the office in 4 weeks and repeat an  ultrasound and determine whether she needs to go to a tertiary center or not.  Consultations:  Gastroenterology, Dr. Karilyn Cota  Discharge Exam: Filed Vitals:   09/27/12 1445 09/27/12 1513 09/27/12 2216 09/28/12 0544  BP: 124/73 150/70 108/63 144/74  Pulse: 65 77 64 73  Temp:  98.4 F (36.9 C) 98.8 F (37.1 C) 99.4 F (37.4 C)  TempSrc:   Oral   Resp: 15 16 18 20   Height:      Weight:      SpO2: 96% 97% 97% 100%    General: She looks systemically well. She is not toxic or septic. Cardiovascular: Heart sounds are present without murmurs or  gallop rhythm. Respiratory: Lung fields are clear. Abdomen is soft and less tender than it was previously. When distracted, her abdomen is not particular tender.  Discharge Instructions  Discharge Orders   Future Orders Complete By Expires     Diet - low sodium heart healthy  As directed     Increase activity slowly  As directed          Medication List    TAKE these medications       LORazepam 0.5 MG tablet  Commonly known as:  ATIVAN  Take 1 tablet (0.5 mg total) by mouth 2 (two) times daily.     oxyCODONE-acetaminophen 5-325 MG per tablet  Commonly known as:  ROXICET  Take 1 tablet by mouth every 4 (four) hours as needed for pain.          The results of significant diagnostics from this hospitalization (including imaging, microbiology, ancillary and laboratory) are listed below for reference.    Significant Diagnostic Studies: Ct Abdomen Pelvis W Contrast  09/24/2012  **ADDENDUM** CREATED: 09/24/2012 23:37:00  Conclusion number 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  No change from prior.  Findings suggest chronic portal venous hypertension.  **END ADDENDUM** SIGNED BY: Andreas Newport, M.D.   09/24/2012  *RADIOLOGY REPORT*  Clinical Data: Nausea and vomiting for 3 days.  Nonspecific abdominal pain.  Back pain.  History of seizures.  CT ABDOMEN AND PELVIS WITH CONTRAST  Technique:  Multidetector CT imaging of the abdomen and pelvis was performed following the standard protocol during bolus administration of intravenous contrast.  Contrast: 50mL OMNIPAQUE IOHEXOL 300 MG/ML  SOLN, OMNIPAQUE IOHEXOL 300 MG/ML  SOLN  Comparison: 03/12/2012 CT.  Findings: Lung Bases: Mild dependent atelectasis.  Scarring in the lingula is unchanged.  Visualized portions of the heart grossly normal.  Descending thoracic aortic atherosclerosis.  Liver:  Mild intrahepatic biliary ductal dilation.  Nodular contour of the liver compatible with hepatic cirrhosis.  No focal mass lesions.  Spleen:  Splenomegaly.  Gallbladder:  Cholecystectomy.  Common bile duct:  Massive dilation of the common bile duct remains present, but unchanged compared to prior.  Common bile duct measures 31 mm on axial imaging which is the same measurements as on the prior exam.  The dilation extends to the ampulla.  Pancreas:  Normal.  Minimally prominent  pancreatic duct.  Adrenal glands:  Normal.  Kidneys:  Normal enhancement.  Normal delayed excretion of contrast.  Both ureters appear normal.  No mass lesions.  Unchanged tortuosity of the proximal right ureter.  Stomach:  Normal.  Small bowel:  Distended duodenal bulb.  No obstruction.  There is flattening of the duodenum at the level of the superior mesenteric artery, which can be associated with functional obstruction "superior mesenteric artery syndrome".  Distal small bowel is well opacified with contrast.  There is no obstruction.  Colon:   Normal appendix. Ascending colon appears normal.  Normal colonic opacification with contrast.  Transverse and descending colon appear normal.  Rectosigmoid normal.  Pelvic Genitourinary:  Physiologic appearance of the uterus and adnexa.  No free fluid.  Urinary bladder normal.  Bones:  No aggressive osseous lesions.  Lumbar scoliosis and spondylosis.  Vasculature: Aortic atherosclerosis extending into the iliac arteries.  No acute vascular abnormality.  IMPRESSION: 1.  No acute abnormality. 2.  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 3.  Cholecystectomy with chronic massive dilation of the  common bile duct.  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  Mild intrahepatic biliary ductal dilation.  Correlation with bilirubin recommended.   Original Report Authenticated By: Andreas Newport, M.D.    Dg Ercp Biliary & Pancreatic Ducts  09/27/2012  *RADIOLOGY REPORT*  Clinical Data: Dilated common bile duct with elevated LFTs  ERCP  Comparison:  None.  Technique:  Multiple spot images obtained with the fluoroscopic device and submitted for interpretation post-procedure.  ERCP was performed by Dr. Karilyn Cota.  1.3 minutes of fluoroscopy was utilized.  Findings: The initial images demonstrate contrast material within the common bile duct which is significantly dilated with  distal tapering.  The sphincterotomy was performed.  IMPRESSION: Findings as above  These images were submitted for radiologic interpretation only. Please see the procedural report for the amount of contrast and the fluoroscopy time utilized.   Original Report Authenticated By: Alcide Clever, M.D.    Dg Foot Complete Right  09/24/2012  *RADIOLOGY REPORT*  Clinical Data: Fall 3 weeks ago  RIGHT FOOT COMPLETE - 3+ VIEW  Comparison: None.  Findings: Three views of the right foot submitted.  There is oblique displaced fracture of the right fifth metatarsal.  IMPRESSION: Oblique displaced fracture of the right fifth metatarsal.   Original Report Authenticated By: Natasha Mead, M.D.     Microbiology: Recent Results (from the past 240 hour(s))  URINE CULTURE     Status: None   Collection Time    09/24/12  7:44 PM      Result Value Range Status   Specimen Description URINE, CLEAN CATCH   Final   Special Requests NONE   Final   Culture  Setup Time 09/24/2012 20:30   Final   Colony Count NO GROWTH   Final   Culture NO GROWTH   Final   Report Status 09/25/2012 FINAL   Final     Labs: Basic Metabolic Panel:  Recent Labs Lab 09/24/12 1751 09/25/12 0430 09/25/12 1520 09/26/12 0455 09/27/12 0536 09/28/12 0640  NA 138  --  134* 141 140 137  K 2.4*  --  3.7 3.3* 3.7 3.5  CL 96  --  101 108 109 104  CO2 31  --  28 26 24 24   GLUCOSE 84  --  95 86 93 115*  BUN 16  --  8 5* 3* <3*  CREATININE 0.66  --  0.67 0.56 0.59 0.56  CALCIUM 10.2  --  8.8 8.6 8.9 8.7  MG  --  2.0  --   --   --   --    Liver Function Tests:  Recent Labs Lab 09/24/12 1751 09/25/12 0430 09/26/12 0455 09/27/12 0536 09/28/12 0640  AST 49* 54* 38* 28 26  ALT 39* 39* 32 26 21  ALKPHOS 85 82 59 62 61  BILITOT 0.3 0.3 0.4 0.3 0.3  PROT 8.3 8.4* 5.9* 6.1 5.8*  ALBUMIN 4.2 4.3 3.1* 3.1* 3.0*    Recent Labs Lab 09/24/12 1751 09/28/12 0640  LIPASE 21  --   AMYLASE  --  26    CBC:  Recent Labs Lab 09/24/12 1751  09/28/12 0640  WBC 9.4 5.1  NEUTROABS 6.1  --   HGB 14.0 10.6*  HCT 42.1 31.1*  MCV 92.7 92.3  PLT 195 98*        Signed:  GOSRANI,NIMISH C  [facility] 09/28/2012, 10:13 AM

## 2012-09-28 NOTE — Progress Notes (Signed)
Patient with orders to be discharge home. Discharge instructions and prescriptions given, patient verbalized understanding. Patient left with daughter in private vehicle. Patient in stable condition upon discharge.

## 2012-09-28 NOTE — Op Note (Signed)
Haley Mathis, Haley Mathis             ACCOUNT NO.:  [id number]  MEDICAL RECORD NO.:  [id number]  LOCATION:  A326                          FACILITY:  [facility]  PHYSICIAN:  Lionel December, M.D.    DATE OF BIRTH:  09/26/1960  DATE OF PROCEDURE:  09/27/2012 DATE OF DISCHARGE:                              OPERATIVE REPORT   PROCEDURE:  ERCP with biliary sphincterotomy.  INDICATION:  The patient is a 52 year old, Caucasian female, who has history of abdominal pain, elevated transaminases and markedly dilated biliary system.  She is undergoing diagnostic/therapeutic ERCP. Procedure risks were reviewed with the patient.  Informed consent was obtained.  MEDICATIONS FOR SEDATION/ANESTHESIA:  Please see anesthesia records for details.  FINDINGS:  Procedure performed in the OR.  After placement of general endotracheal anesthesia, the patient was turned into semiprone position. Time-out was carried out.  Therapeutic Pentax video duodenum scope was passed through oropharynx without any difficulty into esophagus and stomach.  There is a mosaic pattern to gastric mucosa in the proximal stomach.  Pyloric channel was patent.  Scope was passed in the bulb and descending duodenum.  Ampulla of Vater was identified.  Prominent intramural segment  with bile squirting out of the ampullary orifice. CBD was cannulated with Rx 44 Autotome and 0.035 Hydra Jagwire and dilute contrast injected bile duct.  CBD and CHD were markedly dilated. Maximal diameter in the region of common hepatic duct was over 30 mm. Short cystic duct remnant noted with takeoff from the left side.  No filling defects were noted with distal tapering.  Intrahepatic biliary radicals were somewhat prominent, but relatively spared of this dilation.  No filling defects were noted.  Biliary sphincterotomy was performed with gush of bile and contrast in the duodenum.  Stone balloon extractor was insufflated to diameter of 15 mm and passed across  the sphincterotomy without any difficulty.  Endoscope was withdrawn.  The patient tolerated the procedure well and was extubated, and taken to PACU.  FINAL DIAGNOSIS:  Markedly dilated biliary system without filling defects. 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  Biliary sphincterotomy performed with excellent drainage.  I suspect we maybe dealing with SOD dysfunction rather than type 1 choledochal cyst.  RECOMMENDATIONS:  No aspirin for 3 days.  We will repeat her labs, serum amylase in a.m.  Please note, PD was not cannulated or filled with contrast.  We will also order HCV/RNA since the patient's HCV antibody is positive.  We will plan to see her back in the office in 4 weeks and repeat an ultrasound and determine whether she needs to go to a tertiary center or not.          ______________________________ Lionel December, M.D.     NR/MEDQ  D:  09/27/2012  T:  09/28/2012  Job:  [id number]

## 2012-10-01 ENCOUNTER — Encounter (HOSPITAL_COMMUNITY): Payer: Self-pay | Admitting: Internal Medicine

## 2012-10-10 ENCOUNTER — Telehealth (INDEPENDENT_AMBULATORY_CARE_PROVIDER_SITE_OTHER): Payer: Self-pay | Admitting: *Deleted

## 2012-10-10 DIAGNOSIS — B1920 Unspecified viral hepatitis C without hepatic coma: Secondary | ICD-10-CM

## 2012-10-10 NOTE — Telephone Encounter (Signed)
Will draw labs for Hepatitis C Quaint.

## 2012-10-10 NOTE — Telephone Encounter (Signed)
Has not heard back from Dr. Karilyn Cota. He told her he would call with her lab results. Haley Mathis said she was told she might have Hep C. Her return phone number is [phone].

## 2012-10-10 NOTE — Telephone Encounter (Signed)
Spoke with patient. Needs to be drawn again. Will go tomorrow per patient.

## 2012-10-10 NOTE — Telephone Encounter (Signed)
Terri , would you take a look at this. Hep C antibody reactive Hep C RNA Quant is in progress

## 2013-07-15 ENCOUNTER — Encounter (HOSPITAL_COMMUNITY): Payer: Self-pay | Admitting: Emergency Medicine

## 2013-07-15 ENCOUNTER — Inpatient Hospital Stay (HOSPITAL_COMMUNITY)
Admission: EM | Admit: 2013-07-15 | Discharge: 2013-07-18 | DRG: 392 | Disposition: A | Discharge status: Home / Self Care | Payer: Self-pay | Attending: Internal Medicine | Admitting: Internal Medicine

## 2013-07-15 DIAGNOSIS — G40802 Other epilepsy, not intractable, without status epilepticus: Secondary | ICD-10-CM | POA: Diagnosis present

## 2013-07-15 DIAGNOSIS — R197 Diarrhea, unspecified: Secondary | ICD-10-CM

## 2013-07-15 DIAGNOSIS — K7460 Unspecified cirrhosis of liver: Secondary | ICD-10-CM | POA: Diagnosis present

## 2013-07-15 DIAGNOSIS — R7401 Elevation of levels of liver transaminase levels: Secondary | ICD-10-CM | POA: Diagnosis present

## 2013-07-15 DIAGNOSIS — Z8051 Family history of malignant neoplasm of kidney: Secondary | ICD-10-CM

## 2013-07-15 DIAGNOSIS — R894 Abnormal immunological findings in specimens from other organs, systems and tissues: Secondary | ICD-10-CM

## 2013-07-15 DIAGNOSIS — R740 Nonspecific elevation of levels of transaminase and lactic acid dehydrogenase [LDH]: Secondary | ICD-10-CM

## 2013-07-15 DIAGNOSIS — F329 Major depressive disorder, single episode, unspecified: Secondary | ICD-10-CM | POA: Diagnosis present

## 2013-07-15 DIAGNOSIS — R768 Other specified abnormal immunological findings in serum: Secondary | ICD-10-CM | POA: Diagnosis present

## 2013-07-15 DIAGNOSIS — R1110 Vomiting, unspecified: Secondary | ICD-10-CM

## 2013-07-15 DIAGNOSIS — K319 Disease of stomach and duodenum, unspecified: Secondary | ICD-10-CM | POA: Diagnosis present

## 2013-07-15 DIAGNOSIS — E876 Hypokalemia: Secondary | ICD-10-CM | POA: Diagnosis present

## 2013-07-15 DIAGNOSIS — N390 Urinary tract infection, site not specified: Secondary | ICD-10-CM | POA: Diagnosis present

## 2013-07-15 DIAGNOSIS — B1920 Unspecified viral hepatitis C without hepatic coma: Secondary | ICD-10-CM | POA: Diagnosis present

## 2013-07-15 DIAGNOSIS — F3289 Other specified depressive episodes: Secondary | ICD-10-CM | POA: Diagnosis present

## 2013-07-15 DIAGNOSIS — F411 Generalized anxiety disorder: Secondary | ICD-10-CM | POA: Diagnosis present

## 2013-07-15 DIAGNOSIS — R569 Unspecified convulsions: Secondary | ICD-10-CM

## 2013-07-15 DIAGNOSIS — G3184 Mild cognitive impairment, so stated: Secondary | ICD-10-CM | POA: Diagnosis present

## 2013-07-15 DIAGNOSIS — R1013 Epigastric pain: Secondary | ICD-10-CM | POA: Diagnosis present

## 2013-07-15 DIAGNOSIS — K834 Spasm of sphincter of Oddi: Secondary | ICD-10-CM | POA: Diagnosis present

## 2013-07-15 DIAGNOSIS — F17200 Nicotine dependence, unspecified, uncomplicated: Secondary | ICD-10-CM | POA: Diagnosis present

## 2013-07-15 DIAGNOSIS — M1990 Unspecified osteoarthritis, unspecified site: Secondary | ICD-10-CM | POA: Diagnosis present

## 2013-07-15 DIAGNOSIS — R7402 Elevation of levels of lactic acid dehydrogenase (LDH): Secondary | ICD-10-CM | POA: Diagnosis present

## 2013-07-15 DIAGNOSIS — K2960 Other gastritis without bleeding: Principal | ICD-10-CM | POA: Diagnosis present

## 2013-07-15 DIAGNOSIS — Z23 Encounter for immunization: Secondary | ICD-10-CM

## 2013-07-15 HISTORY — DX: Other specified abnormal immunological findings in serum: R76.8

## 2013-07-15 HISTORY — DX: Biliary cyst: K83.5

## 2013-07-15 HISTORY — DX: Spasm of sphincter of Oddi: K83.4

## 2013-07-15 HISTORY — DX: Unspecified viral hepatitis C without hepatic coma: B19.20

## 2013-07-15 HISTORY — DX: Unspecified cirrhosis of liver: K74.60

## 2013-07-15 LAB — CBC WITH DIFFERENTIAL/PLATELET
Basophils Absolute: 0 10*3/uL (ref 0.0–0.1)
Basophils Relative: 0 % (ref 0–1)
EOS PCT: 1 % (ref 0–5)
Eosinophils Absolute: 0.1 10*3/uL (ref 0.0–0.7)
HEMATOCRIT: 34.8 % — AB (ref 36.0–46.0)
Hemoglobin: 12.5 g/dL (ref 12.0–15.0)
LYMPHS ABS: 1.9 10*3/uL (ref 0.7–4.0)
LYMPHS PCT: 27 % (ref 12–46)
MCH: 32.1 pg (ref 26.0–34.0)
MCHC: 35.9 g/dL (ref 30.0–36.0)
MCV: 89.5 fL (ref 78.0–100.0)
MONO ABS: 0.4 10*3/uL (ref 0.1–1.0)
Monocytes Relative: 5 % (ref 3–12)
Neutro Abs: 4.8 10*3/uL (ref 1.7–7.7)
Neutrophils Relative %: 67 % (ref 43–77)
Platelets: 165 10*3/uL (ref 150–400)
RBC: 3.89 MIL/uL (ref 3.87–5.11)
RDW: 13.2 % (ref 11.5–15.5)
WBC: 7.2 10*3/uL (ref 4.0–10.5)

## 2013-07-15 LAB — URINALYSIS, ROUTINE W REFLEX MICROSCOPIC
Bilirubin Urine: NEGATIVE
Glucose, UA: NEGATIVE mg/dL
HGB URINE DIPSTICK: NEGATIVE
Nitrite: NEGATIVE
PROTEIN: 100 mg/dL — AB
Specific Gravity, Urine: 1.025 (ref 1.005–1.030)
UROBILINOGEN UA: 0.2 mg/dL (ref 0.0–1.0)
pH: 7 (ref 5.0–8.0)

## 2013-07-15 LAB — COMPREHENSIVE METABOLIC PANEL
ALT: 31 U/L (ref 0–35)
AST: 39 U/L — ABNORMAL HIGH (ref 0–37)
Albumin: 4.1 g/dL (ref 3.5–5.2)
Alkaline Phosphatase: 124 U/L — ABNORMAL HIGH (ref 39–117)
BUN: 7 mg/dL (ref 6–23)
CALCIUM: 9.9 mg/dL (ref 8.4–10.5)
CO2: 27 meq/L (ref 19–32)
CREATININE: 0.72 mg/dL (ref 0.50–1.10)
Chloride: 100 mEq/L (ref 96–112)
GLUCOSE: 106 mg/dL — AB (ref 70–99)
Potassium: 3.4 mEq/L — ABNORMAL LOW (ref 3.7–5.3)
Sodium: 139 mEq/L (ref 137–147)
Total Bilirubin: 0.4 mg/dL (ref 0.3–1.2)
Total Protein: 8.7 g/dL — ABNORMAL HIGH (ref 6.0–8.3)

## 2013-07-15 LAB — URINE MICROSCOPIC-ADD ON

## 2013-07-15 LAB — LIPASE, BLOOD: Lipase: 54 U/L (ref 11–59)

## 2013-07-15 LAB — RAPID URINE DRUG SCREEN, HOSP PERFORMED
AMPHETAMINES: NOT DETECTED
BARBITURATES: NOT DETECTED
Benzodiazepines: POSITIVE — AB
Cocaine: NOT DETECTED
Opiates: NOT DETECTED
Tetrahydrocannabinol: POSITIVE — AB

## 2013-07-15 MED ORDER — LORAZEPAM 0.5 MG PO TABS
0.5000 mg | ORAL_TABLET | Freq: Three times a day (TID) | ORAL | Status: DC | PRN
  Administered 2013-07-15 – 2013-07-18 (×4): 0.5 mg via ORAL
  Filled 2013-07-15 (×5): qty 1

## 2013-07-15 MED ORDER — ONDANSETRON HCL 4 MG/2ML IJ SOLN
4.0000 mg | Freq: Once | INTRAMUSCULAR | Status: AC
  Administered 2013-07-15: 4 mg via INTRAVENOUS
  Filled 2013-07-15: qty 2

## 2013-07-15 MED ORDER — CIPROFLOXACIN IN D5W 400 MG/200ML IV SOLN
400.0000 mg | Freq: Two times a day (BID) | INTRAVENOUS | Status: DC
  Administered 2013-07-16 – 2013-07-17 (×4): 400 mg via INTRAVENOUS
  Filled 2013-07-15 (×5): qty 200

## 2013-07-15 MED ORDER — ACETAMINOPHEN 325 MG PO TABS
650.0000 mg | ORAL_TABLET | Freq: Four times a day (QID) | ORAL | Status: DC | PRN
Start: 2013-07-15 — End: 2013-07-18
  Administered 2013-07-17: 650 mg via ORAL
  Filled 2013-07-15: qty 2

## 2013-07-15 MED ORDER — POTASSIUM CHLORIDE IN NACL 20-0.9 MEQ/L-% IV SOLN
INTRAVENOUS | Status: DC
  Administered 2013-07-15 – 2013-07-17 (×4): via INTRAVENOUS

## 2013-07-15 MED ORDER — GUAIFENESIN-DM 100-10 MG/5ML PO SYRP: 5.0000 mL | ORAL_SOLUTION | ORAL | Status: DC | PRN

## 2013-07-15 MED ORDER — HYDROMORPHONE HCL PF 1 MG/ML IJ SOLN
1.0000 mg | INTRAMUSCULAR | Status: DC | PRN
  Administered 2013-07-15 – 2013-07-18 (×13): 1 mg via INTRAVENOUS
  Filled 2013-07-15 (×14): qty 1

## 2013-07-15 MED ORDER — SODIUM CHLORIDE 0.9 % IV BOLUS (SEPSIS)
1000.0000 mL | Freq: Once | INTRAVENOUS | Status: AC
  Administered 2013-07-15: 1000 mL via INTRAVENOUS

## 2013-07-15 MED ORDER — ALBUTEROL SULFATE (2.5 MG/3ML) 0.083% IN NEBU: 2.5000 mg | INHALATION_SOLUTION | RESPIRATORY_TRACT | Status: DC | PRN

## 2013-07-15 MED ORDER — PANTOPRAZOLE SODIUM 40 MG IV SOLR
40.0000 mg | INTRAVENOUS | Status: DC
  Administered 2013-07-15: 40 mg via INTRAVENOUS

## 2013-07-15 MED ORDER — HYDROMORPHONE HCL PF 2 MG/ML IJ SOLN
2.0000 mg | Freq: Once | INTRAMUSCULAR | Status: AC
Start: 2013-07-15 — End: 2013-07-15
  Administered 2013-07-15: 2 mg via INTRAVENOUS
  Filled 2013-07-15: qty 1

## 2013-07-15 MED ORDER — CIPROFLOXACIN IN D5W 400 MG/200ML IV SOLN
INTRAVENOUS | Status: AC
  Filled 2013-07-15: qty 200

## 2013-07-15 MED ORDER — PANTOPRAZOLE SODIUM 40 MG IV SOLR
40.0000 mg | INTRAVENOUS | Status: DC
  Administered 2013-07-15 – 2013-07-17 (×3): 40 mg via INTRAVENOUS
  Filled 2013-07-15 (×4): qty 40

## 2013-07-15 MED ORDER — PNEUMOCOCCAL VAC POLYVALENT 25 MCG/0.5ML IJ INJ
0.5000 mL | INJECTION | INTRAMUSCULAR | Status: AC
Start: 2013-07-16 — End: 2013-07-16
  Administered 2013-07-16: 0.5 mL via INTRAMUSCULAR
  Filled 2013-07-15: qty 0.5

## 2013-07-15 MED ORDER — ACETAMINOPHEN 650 MG RE SUPP: 650.0000 mg | Freq: Four times a day (QID) | RECTAL | Status: DC | PRN

## 2013-07-15 MED ORDER — TRAZODONE HCL 50 MG PO TABS
100.0000 mg | ORAL_TABLET | Freq: Every day | ORAL | Status: DC
  Administered 2013-07-15 – 2013-07-17 (×3): 100 mg via ORAL
  Filled 2013-07-15 (×3): qty 2

## 2013-07-15 MED ORDER — OXYCODONE HCL 5 MG PO TABS
5.0000 mg | ORAL_TABLET | ORAL | Status: DC | PRN
  Administered 2013-07-16 – 2013-07-18 (×3): 5 mg via ORAL
  Filled 2013-07-15 (×4): qty 1

## 2013-07-15 MED ORDER — INFLUENZA VAC SPLIT QUAD 0.5 ML IM SUSP
0.5000 mL | INTRAMUSCULAR | Status: AC
  Administered 2013-07-16: 0.5 mL via INTRAMUSCULAR
  Filled 2013-07-15: qty 0.5

## 2013-07-15 MED ORDER — ONDANSETRON HCL 4 MG/2ML IJ SOLN
4.0000 mg | Freq: Four times a day (QID) | INTRAMUSCULAR | Status: DC
  Administered 2013-07-15 – 2013-07-18 (×11): 4 mg via INTRAVENOUS
  Filled 2013-07-15 (×11): qty 2

## 2013-07-15 MED ORDER — HYDROMORPHONE HCL PF 1 MG/ML IJ SOLN
1.0000 mg | Freq: Once | INTRAMUSCULAR | Status: AC
  Administered 2013-07-15: 1 mg via INTRAVENOUS
  Filled 2013-07-15: qty 1

## 2013-07-15 MED ORDER — PROMETHAZINE HCL 25 MG/ML IJ SOLN
12.5000 mg | Freq: Once | INTRAMUSCULAR | Status: AC
  Administered 2013-07-15: 12.5 mg via INTRAVENOUS
  Filled 2013-07-15: qty 1

## 2013-07-15 MED ORDER — PROMETHAZINE HCL 12.5 MG PO TABS
6.2500 mg | ORAL_TABLET | Freq: Four times a day (QID) | ORAL | Status: DC | PRN
  Administered 2013-07-16 (×2): 6.25 mg via ORAL
  Filled 2013-07-15 (×2): qty 1

## 2013-07-15 MED ORDER — CIPROFLOXACIN IN D5W 400 MG/200ML IV SOLN
400.0000 mg | Freq: Once | INTRAVENOUS | Status: AC
  Administered 2013-07-15: 400 mg via INTRAVENOUS
  Filled 2013-07-15: qty 200

## 2013-07-15 NOTE — ED Provider Notes (Signed)
CSN: [id number]     Arrival date & time 07/15/13  1246 History  This chart was scribed for Geoffery Lyonsouglas Dhiya Smits, MD, by Yevette EdwardsAngela Bracken, ED Scribe. This patient was seen in room [facility] and the patient's care was started at 1:19 PM.   None    Chief Complaint  Patient presents with  . Abdominal Pain    The history is provided by the patient and a relative. No language interpreter was used.   HPI Comments: Judye BosSheila M Vanterpool is a 53 y.o. Female, with a h/o Hepatitis, seizures, and anxiety, who presents to the Emergency Department complaining of "sharp" abdominal pain which has persisted for two weeks, but has worsened in the past three days. The pt states the pain has prevented her from sleeping well, and she rates the pain as 10/10. As associated symptoms, the pt has experienced nausea, emesis, and diarrhea. She reports the pain is similar to the pain she experienced prior to having bile duct surgery three months ago. [facility]; she had a CT scan performed though she did not know the results. The pt has a h/o abdominal surgery including cholecystectomy. She denies using alcohol. The pt is a current smoker.   Past Medical History  Diagnosis Date  . Osteoarthritis   . Anxiety   . Depression   . Seizures    Past Surgical History  Procedure Laterality Date  . Tubal ligation    . Colonoscopy w/ polypectomy    . Cholecystectomy    . Ercp N/A 09/27/2012    Procedure: ENDOSCOPIC RETROGRADE CHOLANGIOPANCREATOGRAPHY (ERCP);  Surgeon: Malissa HippoNajeeb U Rehman, MD;  Location: [facility];  Service: Endoscopy;  Laterality: N/A;  . Sphincterotomy N/A 09/27/2012    Procedure: SPHINCTEROTOMY;  Surgeon: Malissa HippoNajeeb U Rehman, MD;  Location: [facility];  Service: Endoscopy;  Laterality: N/A;  . Balloon dilation N/A 09/27/2012    Procedure: BALLOON DILATION;  Surgeon: Malissa HippoNajeeb U Rehman, MD;  Location: [facility];  Service: Endoscopy;  Laterality: N/A;   No family history on file. History   Substance Use Topics  . Smoking status: Current Every Day Smoker -- 1.00 packs/day    Types: Cigarettes  . Smokeless tobacco: Not on file  . Alcohol Use: No   No OB history provided.  Review of Systems  Constitutional: Negative for fever.  Gastrointestinal: Positive for nausea, vomiting, abdominal pain and diarrhea.  Psychiatric/Behavioral: Positive for sleep disturbance.  All other systems reviewed and are negative.   Allergies  Review of patient's allergies indicates no known allergies.  Home Medications   Current Outpatient Rx  Name  Route  Sig  Dispense  Refill  . LORazepam (ATIVAN) 0.5 MG tablet   Oral   Take 1 tablet (0.5 mg total) by mouth 2 (two) times daily.   30 tablet   0   . oxyCODONE-acetaminophen (ROXICET) 5-325 MG per tablet   Oral   Take 1 tablet by mouth every 4 (four) hours as needed for pain.   30 tablet   0    Triage Vitals: BP 161/92  Pulse 103  Temp(Src) 98.6 F (37 C) (Oral)  Resp 18  Ht 6' (1.829 m)  Wt 120 lb (54.432 kg)  BMI 16.27 kg/m2  SpO2 98%  Physical Exam  Nursing note and vitals reviewed. Constitutional: She is oriented to person, place, and time. She appears well-developed and well-nourished. No distress.  HENT:  Head: Normocephalic and atraumatic.  Eyes: EOM are normal.  Neck:  Neck supple. No tracheal deviation present.  Cardiovascular: Normal rate, regular rhythm and normal heart sounds.   Pulmonary/Chest: Effort normal and breath sounds normal. No respiratory distress. She has no wheezes.  Abdominal: Bowel sounds are normal. There is tenderness.  Moderate tenderness to palpation in the epigastric region. There is voluntary guarding with no rebound.   Musculoskeletal: Normal range of motion.  Neurological: She is alert and oriented to person, place, and time.  Skin: Skin is warm and dry.  Psychiatric: She has a normal mood and affect. Her behavior is normal.    ED Course  Procedures (including critical care  time)  DIAGNOSTIC STUDIES: Oxygen Saturation is 98% on room air, normal by my interpretation.    COORDINATION OF CARE:  1:24 PM- Discussed treatment plan with patient, and the patient agreed to the plan.   Labs Review Labs Reviewed  COMPREHENSIVE METABOLIC PANEL - Abnormal; Notable for the following:    Potassium 3.4 (*)    Glucose, Bld 106 (*)    Total Protein 8.7 (*)    AST 39 (*)    Alkaline Phosphatase 124 (*)    All other components within normal limits  URINALYSIS, ROUTINE W REFLEX MICROSCOPIC - Abnormal; Notable for the following:    APPearance CLOUDY (*)    Ketones, ur TRACE (*)    Protein, ur 100 (*)    Leukocytes, UA SMALL (*)    All other components within normal limits  URINE MICROSCOPIC-ADD ON - Abnormal; Notable for the following:    Squamous Epithelial / LPF FEW (*)    Bacteria, UA MANY (*)    Casts GRANULAR CAST (*)    All other components within normal limits  URINE CULTURE  LIPASE, BLOOD  CBC WITH DIFFERENTIAL   Imaging Review No results found.    MDM  No diagnosis found. Patient is a 52 year old female presents with complaints of severe epigastric pain. She has a history of Sphincter of Oddi dysfunction and has required ERCP with dilatation in the past. Workup reveals mildly elevated liver functions and normal lipase. CBC at this time is pending. She is required significant amount pain medication and appears as though she will require admission. I've spoken with Dr. Karilyn Cota from gastroenterology who agrees with this assessment. Dr. Sherrie Mustache agrees to admit.  I personally performed the services described in this documentation, which was scribed in my presence. The recorded information has been reviewed and is accurate.     Geoffery Lyons, MD 07/15/13 [phone]

## 2013-07-15 NOTE — H&P (Signed)
[facility] History and Physical  Haley Mathis WJX:[id number] DOB: [birth year] DOA: 07/15/2013  Referring physician: ED physician, Dr. Judd Lien PCP: No PCP Per Patient   Chief Complaint: Abdominal pain and nausea.  HPI: Haley Mathis is a 53 y.o. female with a history of sphincter of Oddi dysfunction, status post sphincterotomy and drainage 3 2014 by Dr. Karilyn Cota, hepatitis C. antibody positivity, and cirrhosis, who presents to the emergency department with a chief complaint of abdominal pain. Her abdominal pain started approximately 2 weeks ago. It has been intermittent up until several days ago when the pain became more constant. She describes the pain as a hurting pain. It is located in the epigastrium with some radiation to the right upper quadrant. She rates the pain an 8/10-10 over 10 in intensity. It is associated with nausea now, but she had multiple episodes of nausea and vomiting several days ago. She has not vomited in 2 days. She ate for breakfast this morning and it stayed down. She also had loose stools "diarrhea" for several days, but she has not had a bowel movement in 24 hours. She denies coffee grounds emesis. She denies black tarry stools. She denies pain with urination. She denies subjective fever, chills, headache, chest pain, shortness of breath, cough, or swelling in her legs.  Of note, the patient presented to the emergency department at [facility] on 07/09/2013. A CT scan of her abdomen and pelvis with contrast was ordered and revealed among other things, a cirrhotic liver, cholecystectomy, and market central intra-and extrahepatic biliary ductal dilatation with the common bile duct measuring up to 20 mm.  In the emergency department, she is hemodynamically stable and afebrile. Her lab data are significant for potassium of 3.4, mildly elevated AST of 39, normal lipase of 54, normal WBC of 7.2. Her urinalysis reveals 7-10 WBCs and many bacteria. She  is being admitted for further evaluation and management.     Review of Systems:  As above in history present illness, otherwise negative.    Past Medical History  Diagnosis Date  . Osteoarthritis   . Anxiety   . Depression   . Seizures   . 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  . 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 Cirrhosis 09/25/2012  . Hepatitis C antibody test positive 08/2012   Past Surgical History  Procedure Laterality Date  . Tubal ligation    . Colonoscopy w/ polypectomy    . Cholecystectomy    . Ercp N/A 09/27/2012    Procedure: ENDOSCOPIC RETROGRADE CHOLANGIOPANCREATOGRAPHY (ERCP);  Surgeon: Malissa Hippo, MD;  Location: [facility];  Service: Endoscopy;  Laterality: N/A;  . Sphincterotomy N/A 09/27/2012    Procedure: SPHINCTEROTOMY;  Surgeon: Malissa Hippo, MD;  Location: [facility];  Service: Endoscopy;  Laterality: N/A;  . Balloon dilation N/A 09/27/2012    Procedure: BALLOON DILATION;  Surgeon: Malissa Hippo, MD;  Location: [facility];  Service: Endoscopy;  Laterality: N/A;   Social History: She is married. She lives in [place]. She has 4 children. She smokes less than a half a pack of cigarettes daily. She denies illicit drug use or alcohol use.   No Known Allergies  Family history: Her mother died of kidney cancer. Her father died of an aortic aneurysm.  Prior to Admission medications   Medication Sig Start Date End Date Taking? Authorizing Provider  HYDROcodone-acetaminophen (NORCO/VICODIN) 5-325 MG per tablet Take 1 tablet by mouth  every 6 (six) hours as needed. 07/09/13  Yes Historical Provider, MD  ibuprofen (ADVIL,MOTRIN) 200 MG tablet Take 600 mg by mouth every 8 (eight) hours as needed for moderate pain.   Yes Historical Provider, MD  ondansetron (ZOFRAN ODT) 4 MG disintegrating tablet Take 4 mg by mouth every 8 (eight) hours as needed. 07/09/13  Yes Historical Provider, MD  traZODone (DESYREL) 100 MG tablet Take 100  mg by mouth at bedtime.   Yes Historical Provider, MD   Physical Exam: Filed Vitals:   07/15/13 1743  BP: 153/92  Pulse: 73  Temp: 98.5 F (36.9 C)  Resp: 18    BP 153/92  Pulse 73  Temp(Src) 98.5 F (36.9 C) (Oral)  Resp 18  Ht 6' (1.829 m)  Wt 54.432 kg (120 lb)  BMI 16.27 kg/m2  SpO2 97%  General:  Appears calm and comfortable Eyes: PERRL, normal lids, irises & conjunctiva ENT: grossly normal hearing, lips & tongue. Mucous membranes are moist.. Dentures. Neck: no LAD, masses or thyromegaly Cardiovascular: RRR, no m/r/g. No LE edema. Telemetry: SR, no arrhythmias  Respiratory: CTA bilaterally, no w/r/r. Normal respiratory effort. Abdomen: soft, positive bowel sounds, moderately tender in the epigastrium and mildly tender in the right upper quadrant. No masses palpated. No hepatosplenomegaly. No distention. Skin: She has several colored tattoos on her arms and legs; no rash or induration seen on limited exam Musculoskeletal: grossly normal tone BUE/BLE Psychiatric: She is slightly anxious. She is forgetful about her medical history. Her speech is clear. Neurologic: She is alert and oriented x2. Cranial nerves II through XII are intact. Overall, nonfocal.           Labs on Admission:  Basic Metabolic Panel:  Recent Labs Lab 07/15/13 1326  NA 139  K 3.4*  CL 100  CO2 27  GLUCOSE 106*  BUN 7  CREATININE 0.72  CALCIUM 9.9   Liver Function Tests:  Recent Labs Lab 07/15/13 1326  AST 39*  ALT 31  ALKPHOS 124*  BILITOT 0.4  PROT 8.7*  ALBUMIN 4.1    Recent Labs Lab 07/15/13 1326  LIPASE 54   No results found for this basename: AMMONIA,  in the last 168 hours CBC:  Recent Labs Lab 07/15/13 1615  WBC 7.2  NEUTROABS 4.8  HGB 12.5  HCT 34.8*  MCV 89.5  PLT 165   Cardiac Enzymes: No results found for this basename: CKTOTAL, CKMB, CKMBINDEX, TROPONINI,  in the last 168 hours  BNP (last 3 results) No results found for this basename: PROBNP,  in  the last 8760 hours CBG: No results found for this basename: GLUCAP,  in the last 168 hours  Radiological Exams on Admission: No results found.  EKG: Not ordered.  Assessment/Plan Principal Problem:   Epigastric pain Active Problems:   Cirrhosis   Sphincter of Oddi dysfunction   UTI (lower urinary tract infection)   Hypokalemia   Hepatitis C antibody test positive   Elevated AST (SGOT)   1. This is a patient with a history of sphincter of Oddi dysfunction, status post sphincterotomy and drainage in March 2014, who presents with recurrent epigastric abdominal pain. She presented to [facility] on 07/09/2013. CT scan there revealed marked central intra-extrahepatic biliary ductal dilatation which the patient had in March 2014. Her lipase is within normal limits. She has a mildly isolated elevated AST. Her white blood cell count is within normal limits. Her bilirubin is within normal limits. She has a known history of cirrhosis, presumed to  be secondary to hepatitis C, but the HCV quantitative ordered last year was not done. She is also noted to have a urinary tract infection, but she is not symptomatic. [facility] also revealed a thickened bladder wall. She will therefore be started on treatment with Cipro. Dr. Karilyn Cotaehman has already been notified about the patient. He will evaluate her tomorrow and make further recommendations. Clear liquid diet will be started per his recommendation.    Plan: 1. The patient received several doses of IV hydromorphone and IV Zofran in the emergency department. She also received IV fluids and Cipro. 2. We'll continue IV fluid hydration. We'll add potassium chloride to the IV fluids. 3. Will continue Cipro IV every 12 hours. Urine culture is pending. 4. We'll discontinue ibuprofen. Start IV Protonix empirically. 5. We'll start scheduled Zofran every 6 hours IV and when necessary Phenergan. 6. Clear liquid diet for now. 7. For further evaluation, we'll  order HCV quantitative and TSH tomorrow morning.  Code Status: Full code. Family Communication: Discussed with daughters. Disposition Plan: Anticipate discharge to home when clinically appropriate.  Time spent: One hour.  [facility] HospitalFISHER,Ariannie Penaloza [facility] Pager [phone]

## 2013-07-15 NOTE — ED Notes (Signed)
Reports has surgery to open bile duct in September or October 2014.  States thinks it has closed back up again.  Reports same type of pain except worse.  Upper abd pain with n/v/d x 1 wk, pain worsening x 3 days.

## 2013-07-16 ENCOUNTER — Inpatient Hospital Stay (HOSPITAL_COMMUNITY): Payer: Self-pay

## 2013-07-16 DIAGNOSIS — R110 Nausea: Secondary | ICD-10-CM

## 2013-07-16 DIAGNOSIS — R197 Diarrhea, unspecified: Secondary | ICD-10-CM

## 2013-07-16 DIAGNOSIS — K834 Spasm of sphincter of Oddi: Secondary | ICD-10-CM

## 2013-07-16 DIAGNOSIS — R1110 Vomiting, unspecified: Secondary | ICD-10-CM

## 2013-07-16 DIAGNOSIS — R1013 Epigastric pain: Secondary | ICD-10-CM

## 2013-07-16 LAB — COMPREHENSIVE METABOLIC PANEL
ALT: 24 U/L (ref 0–35)
AST: 31 U/L (ref 0–37)
Albumin: 3.4 g/dL — ABNORMAL LOW (ref 3.5–5.2)
Alkaline Phosphatase: 96 U/L (ref 39–117)
BUN: 7 mg/dL (ref 6–23)
CALCIUM: 9.3 mg/dL (ref 8.4–10.5)
CO2: 23 meq/L (ref 19–32)
Chloride: 103 mEq/L (ref 96–112)
Creatinine, Ser: 0.72 mg/dL (ref 0.50–1.10)
GLUCOSE: 80 mg/dL (ref 70–99)
Potassium: 4.2 mEq/L (ref 3.7–5.3)
Sodium: 138 mEq/L (ref 137–147)
Total Bilirubin: 0.5 mg/dL (ref 0.3–1.2)
Total Protein: 7.2 g/dL (ref 6.0–8.3)

## 2013-07-16 LAB — CBC
HCT: 34.9 % — ABNORMAL LOW (ref 36.0–46.0)
Hemoglobin: 12.4 g/dL (ref 12.0–15.0)
MCH: 32 pg (ref 26.0–34.0)
MCHC: 35.5 g/dL (ref 30.0–36.0)
MCV: 89.9 fL (ref 78.0–100.0)
Platelets: 185 10*3/uL (ref 150–400)
RBC: 3.88 MIL/uL (ref 3.87–5.11)
RDW: 13.3 % (ref 11.5–15.5)
WBC: 7.2 10*3/uL (ref 4.0–10.5)

## 2013-07-16 LAB — URINE CULTURE
COLONY COUNT: NO GROWTH
CULTURE: NO GROWTH

## 2013-07-16 LAB — TSH: TSH: 1.431 u[IU]/mL (ref 0.350–4.500)

## 2013-07-16 MED ORDER — BOOST / RESOURCE BREEZE PO LIQD
1.0000 | Freq: Three times a day (TID) | ORAL | Status: DC
  Administered 2013-07-16 – 2013-07-18 (×6): 1 via ORAL

## 2013-07-16 MED ORDER — PROMETHAZINE HCL 25 MG/ML IJ SOLN
6.2500 mg | Freq: Four times a day (QID) | INTRAMUSCULAR | Status: DC | PRN
  Administered 2013-07-16 – 2013-07-18 (×3): 6.25 mg via INTRAVENOUS
  Filled 2013-07-16 (×3): qty 1

## 2013-07-16 MED ORDER — HYOSCYAMINE SULFATE 0.125 MG SL SUBL
0.1250 mg | SUBLINGUAL_TABLET | SUBLINGUAL | Status: DC | PRN
  Administered 2013-07-16: 0.125 mg via SUBLINGUAL
  Filled 2013-07-16: qty 1

## 2013-07-16 MED ORDER — IOHEXOL 300 MG/ML  SOLN
100.0000 mL | Freq: Once | INTRAMUSCULAR | Status: AC | PRN
  Administered 2013-07-16: 100 mL via INTRAVENOUS

## 2013-07-16 MED ORDER — ZOLPIDEM TARTRATE 5 MG PO TABS: 10.0000 mg | ORAL_TABLET | Freq: Every evening | ORAL | Status: DC | PRN

## 2013-07-16 MED ORDER — LORAZEPAM 2 MG/ML IJ SOLN: 2.0000 mg | INTRAMUSCULAR | Status: DC | PRN

## 2013-07-16 NOTE — Progress Notes (Signed)
INITIAL NUTRITION ASSESSMENT  DOCUMENTATION CODES Per approved criteria  -Not Applicable   INTERVENTION:  Resource Breeze po TID, each supplement provides 250 kcal and 9 grams of protein RD will follow nutrition care   NUTRITION DIAGNOSIS: Inadequate oral intake related to epigastric pain as evidenced by pt reported poor tolerance of oral intake x 7 days.        Goal: Pt to meet >/= 90% of their estimated nutrition needs    Monitor:  Diet advancement, po intake, labs and wt trends   Reason for Assessment: Malnutrition Screen score = 2  53 y.o. female  Admitting Dx: Epigastric pain  Patient Active Problem List   Diagnosis Date Noted  . Epigastric pain 07/15/2013  . Sphincter of Oddi dysfunction 07/15/2013  . Hepatitis C antibody test positive 07/15/2013  . UTI (lower urinary tract infection) 07/15/2013  . Elevated AST (SGOT) 07/15/2013  . Metatarsal fracture 09/25/2012  . Hypokalemia 09/25/2012  . Vomiting and diarrhea 09/25/2012  . Cirrhosis 09/25/2012  . Tobacco abuse 09/25/2012  . Marijuana use 09/25/2012  . Headache 09/25/2012  . 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 Tolerating clear liquids today without increased pain. She reports limited po for past 7 days and acute wt loss of 10#. Unable to complete nutrition focused exam at this time. Suspect pt is malnourished given her acute illness and chronic disease hx. Will continue to follow and re-assess as her symptoms improve and she is more comfortable.   Height: Ht Readings from Last 1 Encounters:  07/15/13 5\' 5"  (1.651 m)    Weight: Wt Readings from Last 1 Encounters:  07/15/13 121 lb 7.6 oz (55.1 kg)    Ideal Body Weight: 125# (56.8 kg)  % Ideal Body Weight: 97%  Wt Readings from Last 10 Encounters:  07/15/13 121 lb 7.6 oz (55.1 kg)  09/27/12 136 lb (61.689 kg)  09/27/12 136 lb (61.689 kg)  03/12/12 138 lb (62.596 kg)  04/28/11 138 lb (62.596 kg)   01/26/11 162 lb (73.483 kg)    Usual Body Weight: 135#  % Usual Body Weight: 90%  BMI:  Body mass index is 20.21 kg/(m^2).normal range  Estimated Nutritional Needs: Kcal: [id number] Protein: 65-75 gr Fluid: 1800 ml daily  Skin: intact  Diet Order: Clear Liquid 50%  EDUCATION NEEDS: -Education needs addressed   Intake/Output Summary (Last 24 hours) at 07/16/13 1418 Last data filed at 07/16/13 0900  Gross per 24 hour  Intake    120 ml  Output      2 ml  Net    118 ml    Last BM: 07/15/13   Labs:   Recent Labs Lab 07/15/13 1326 07/16/13 0511  NA 139 138  K 3.4* 4.2  CL 100 103  CO2 27 23  BUN 7 7  CREATININE 0.72 0.72  CALCIUM 9.9 9.3  GLUCOSE 106* 80    CBG (last 3)  No results found for this basename: GLUCAP,  in the last 72 hours  Scheduled Meds: . ciprofloxacin  400 mg Intravenous Q12H  . ondansetron (ZOFRAN) IV  4 mg Intravenous Q6H  . pantoprazole (PROTONIX) IV  40 mg Intravenous Q24H  . traZODone  100 mg Oral QHS    Continuous Infusions: . 0.9 % NaCl with KCl 20 mEq / L 75 mL/hr at 07/16/13 1307    Past Medical History  Diagnosis Date  . Osteoarthritis   . Anxiety   . Depression   .  Seizures   . 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  . 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 Cirrhosis 09/25/2012  . Hepatitis C antibody test positive 08/2012    Past Surgical History  Procedure Laterality Date  . Tubal ligation    . Colonoscopy w/ polypectomy    . Cholecystectomy    . Ercp N/A 09/27/2012    Procedure: ENDOSCOPIC RETROGRADE CHOLANGIOPANCREATOGRAPHY (ERCP);  Surgeon: Malissa Hippo, MD;  Location: [facility];  Service: Endoscopy;  Laterality: N/A;  . Sphincterotomy N/A 09/27/2012    Procedure: SPHINCTEROTOMY;  Surgeon: Malissa Hippo, MD;  Location: [facility];  Service: Endoscopy;  Laterality: N/A;  . Balloon dilation N/A 09/27/2012    Procedure: BALLOON DILATION;  Surgeon: Malissa Hippo, MD;   Location: [facility];  Service: Endoscopy;  Laterality: N/A;    Royann Shivers MS,RD,CSG,LDN Office: [phone] Pager: [phone]

## 2013-07-16 NOTE — Progress Notes (Signed)
Nurse tech came in to take patients vitals at 2230 and saw pts eyes were rolling around and her arms started flailing around. The episode lasted for about a minute and a half. Vital signs were stable. Patient now alert. MD called to the floor and assessed. Pt has hx of seizures but is not on any medications. Ativan prn for seizures, neuro consult, and EEG were ordered. Will continue to monitor.

## 2013-07-16 NOTE — Progress Notes (Signed)
Called by nurse to evaluate the patient for possible seizure. As per nursing staff patient was seen agitated, flailing her arms, moving her head side to side. This lasted for 1 minute, at this time patient is alert but mildly confused, she is able to answer questions. Patient admits to having history of seizures and had last seizure, a month ago. Patient is not taking any antiepileptic medications. I will order an EEG and get neurology consultation in the morning. We'll start the patient on Ativan 2 mg IV every 4 hours when necessary, if she gets another seizure tonight then we'll give her IV Dilantin or Keppra.

## 2013-07-16 NOTE — Progress Notes (Signed)
UR chart review completed.  

## 2013-07-16 NOTE — Care Management Note (Signed)
    Page 1 of 1   07/16/2013     11:59:01 AM   CARE MANAGEMENT NOTE 07/16/2013  Patient:  Haley Mathis,Haley Mathis   Account Number:  [id number]  Date Initiated:  07/16/2013  Documentation initiated by:  Sharrie RothmanBLACKWELL,Lanya Bucks C  Subjective/Objective Assessment:   Pt admitted from home with abd pain. Pt lives with her husband and will return home at discharge. Pt is fairly independent with ADl's.     Action/Plan:   Pt has no insurance but has a hearing for Medicaid and disablility. Financial counselor is aware and will make contact with pt. May need MATCH voucher at discharge.   Anticipated DC Date:  07/19/2013   Anticipated DC Plan:  HOME/SELF CARE  In-house referral  Financial Counselor      DC Planning Services  CM consult      Choice offered to / List presented to:             Status of service:  Completed, signed off Medicare Important Message given?   (If response is "NO", the following Medicare IM given date fields will be blank) Date Medicare IM given:   Date Additional Medicare IM given:    Discharge Disposition:  HOME/SELF CARE  Per UR Regulation:    If discussed at Long Length of Stay Meetings, dates discussed:    Comments:  07/16/13 1200 Arlyss Queenammy Erik Nessel, RN BSN CM

## 2013-07-16 NOTE — Consult Note (Signed)
Reason for Consult:abdominal pain Referring Physician: Hospitalist services  Haley Mathis is an 53 y.o. female.  HPI: Admitted thru the ED yesterday with epigastric and left upper quadrant pain. She has had the pain x 1 week. She rates the pain 7/10.  She tells me this pain is similar to the pain back in March.  She underwent an ERCP with sphincterotomy in March for dilated biliary system: FINAL DIAGNOSIS: Markedly dilated biliary system without filling  defects.  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 Biliary sphincterotomy performed  with excellent drainage.  I suspect we maybe dealing with SOD dysfunction rather  than type 1 choledochal cyst.  She also has nausea. She has had nausea x 2 weeks.  She has lost 10 pounds since last Wednesday. Hx of Hepatitis C. Has not been treated. No etoh. No IV drugs. She has approximately 15 tattoos.  She denies having a fever.  She cannot tell me if she is having any acid reflux. She has not been taking ASA or ALEVE. She underwent a cholecystomy years ago for cholelithiasis in 1995.  Seen at [facility] on 07/09/2013 for same type of pain.     Past Medical History  Diagnosis Date  . Osteoarthritis   . Anxiety   . Depression   . Seizures   . 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  . 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 Cirrhosis 09/25/2012  . Hepatitis C antibody test positive 08/2012    Past Surgical History  Procedure Laterality Date  . Tubal ligation    . Colonoscopy w/ polypectomy    . Cholecystectomy    . Ercp N/A 09/27/2012    Procedure: ENDOSCOPIC RETROGRADE CHOLANGIOPANCREATOGRAPHY (ERCP);  Surgeon: Rogene Houston, MD;  Location: [facility];  Service: Endoscopy;  Laterality: N/A;  . Sphincterotomy N/A 09/27/2012    Procedure: SPHINCTEROTOMY;  Surgeon: Rogene Houston, MD;  Location: [facility];  Service: Endoscopy;  Laterality: N/A;  . Balloon dilation N/A 09/27/2012    Procedure: BALLOON DILATION;  Surgeon:  Rogene Houston, MD;  Location: [facility];  Service: Endoscopy;  Laterality: N/A;    History reviewed. No pertinent family history.  Social History:  reports that she has been smoking Cigarettes.  She has been smoking about 1.00 pack per day. She does not have any smokeless tobacco history on file. She reports that she does not drink alcohol or use illicit drugs.  Allergies: No Known Allergies  Medications: I have reviewed the patient's current medications.  Results for orders placed during the hospital encounter of 07/15/13 (from the past 48 hour(s))  COMPREHENSIVE METABOLIC PANEL     Status: Abnormal   Collection Time    07/15/13  1:26 PM      Result Value Range   Sodium 139  137 - 147 mEq/L   Potassium 3.4 (*) 3.7 - 5.3 mEq/L   Chloride 100  96 - 112 mEq/L   CO2 27  19 - 32 mEq/L   Glucose, Bld 106 (*) 70 - 99 mg/dL   BUN 7  6 - 23 mg/dL   Creatinine, Ser 0.72  0.50 - 1.10 mg/dL   Calcium 9.9  8.4 - 10.5 mg/dL   Total Protein 8.7 (*) 6.0 - 8.3 g/dL   Albumin 4.1  3.5 - 5.2 g/dL   AST 39 (*) 0 - 37 U/L   ALT 31  0 - 35 U/L   Alkaline Phosphatase 124 (*) 39 - 117 U/L  Total Bilirubin 0.4  0.3 - 1.2 mg/dL   GFR calc non Af Amer >90  >90 mL/min   GFR calc Af Amer >90  >90 mL/min   Comment: (NOTE)     The eGFR has been calculated using the CKD EPI equation.     This calculation has not been validated in all clinical situations.     eGFR's persistently <90 mL/min signify possible Chronic Kidney     Disease.  LIPASE, BLOOD     Status: None   Collection Time    07/15/13  1:26 PM      Result Value Range   Lipase 54  11 - 59 U/L  URINALYSIS, ROUTINE W REFLEX MICROSCOPIC     Status: Abnormal   Collection Time    07/15/13  1:35 PM      Result Value Range   Color, Urine YELLOW  YELLOW   APPearance CLOUDY (*) CLEAR   Specific Gravity, Urine 1.025  1.005 - 1.030   pH 7.0  5.0 - 8.0   Glucose, UA NEGATIVE  NEGATIVE mg/dL   Hgb urine dipstick NEGATIVE  NEGATIVE   Bilirubin Urine  NEGATIVE  NEGATIVE   Ketones, ur TRACE (*) NEGATIVE mg/dL   Protein, ur 100 (*) NEGATIVE mg/dL   Urobilinogen, UA 0.2  0.0 - 1.0 mg/dL   Nitrite NEGATIVE  NEGATIVE   Leukocytes, UA SMALL (*) NEGATIVE  URINE MICROSCOPIC-ADD ON     Status: Abnormal   Collection Time    07/15/13  1:35 PM      Result Value Range   Squamous Epithelial / LPF FEW (*) RARE   Comment: CORRECTED ON 01/13 AT 1413: PREVIOUSLY REPORTED AS RARE   WBC, UA 7-10  <3 WBC/hpf   RBC / HPF 0-2  <3 RBC/hpf   Bacteria, UA MANY (*) RARE   Comment: CORRECTED ON 01/13 AT 1413: PREVIOUSLY REPORTED AS RARE   Casts GRANULAR CAST (*) NEGATIVE   Urine-Other MUCOUS PRESENT    CBC WITH DIFFERENTIAL     Status: Abnormal   Collection Time    07/15/13  4:15 PM      Result Value Range   WBC 7.2  4.0 - 10.5 K/uL   RBC 3.89  3.87 - 5.11 MIL/uL   Hemoglobin 12.5  12.0 - 15.0 g/dL   HCT 34.8 (*) 36.0 - 46.0 %   MCV 89.5  78.0 - 100.0 fL   MCH 32.1  26.0 - 34.0 pg   MCHC 35.9  30.0 - 36.0 g/dL   RDW 13.2  11.5 - 15.5 %   Platelets 165  150 - 400 K/uL   Neutrophils Relative % 67  43 - 77 %   Neutro Abs 4.8  1.7 - 7.7 K/uL   Lymphocytes Relative 27  12 - 46 %   Lymphs Abs 1.9  0.7 - 4.0 K/uL   Monocytes Relative 5  3 - 12 %   Monocytes Absolute 0.4  0.1 - 1.0 K/uL   Eosinophils Relative 1  0 - 5 %   Eosinophils Absolute 0.1  0.0 - 0.7 K/uL   Basophils Relative 0  0 - 1 %   Basophils Absolute 0.0  0.0 - 0.1 K/uL  URINE RAPID DRUG SCREEN (HOSP PERFORMED)     Status: Abnormal   Collection Time    07/15/13  5:12 PM      Result Value Range   Opiates NONE DETECTED  NONE DETECTED   Cocaine NONE DETECTED  NONE DETECTED  Benzodiazepines POSITIVE (*) NONE DETECTED   Amphetamines NONE DETECTED  NONE DETECTED   Tetrahydrocannabinol POSITIVE (*) NONE DETECTED   Barbiturates NONE DETECTED  NONE DETECTED   Comment:            DRUG SCREEN FOR MEDICAL PURPOSES     ONLY.  IF CONFIRMATION IS NEEDED     FOR ANY PURPOSE, NOTIFY LAB      WITHIN 5 DAYS.                LOWEST DETECTABLE LIMITS     FOR URINE DRUG SCREEN     Drug Class       Cutoff (ng/mL)     Amphetamine      1000     Barbiturate      200     Benzodiazepine   607     Tricyclics       371     Opiates          300     Cocaine          300     THC              50  COMPREHENSIVE METABOLIC PANEL     Status: Abnormal   Collection Time    07/16/13  5:11 AM      Result Value Range   Sodium 138  137 - 147 mEq/L   Potassium 4.2  3.7 - 5.3 mEq/L   Comment: DELTA CHECK NOTED   Chloride 103  96 - 112 mEq/L   CO2 23  19 - 32 mEq/L   Glucose, Bld 80  70 - 99 mg/dL   BUN 7  6 - 23 mg/dL   Creatinine, Ser 0.72  0.50 - 1.10 mg/dL   Calcium 9.3  8.4 - 10.5 mg/dL   Total Protein 7.2  6.0 - 8.3 g/dL   Albumin 3.4 (*) 3.5 - 5.2 g/dL   AST 31  0 - 37 U/L   ALT 24  0 - 35 U/L   Alkaline Phosphatase 96  39 - 117 U/L   Total Bilirubin 0.5  0.3 - 1.2 mg/dL   GFR calc non Af Amer >90  >90 mL/min   GFR calc Af Amer >90  >90 mL/min   Comment: (NOTE)     The eGFR has been calculated using the CKD EPI equation.     This calculation has not been validated in all clinical situations.     eGFR's persistently <90 mL/min signify possible Chronic Kidney     Disease.  CBC     Status: Abnormal   Collection Time    07/16/13  5:11 AM      Result Value Range   WBC 7.2  4.0 - 10.5 K/uL   RBC 3.88  3.87 - 5.11 MIL/uL   Hemoglobin 12.4  12.0 - 15.0 g/dL   HCT 34.9 (*) 36.0 - 46.0 %   MCV 89.9  78.0 - 100.0 fL   MCH 32.0  26.0 - 34.0 pg   MCHC 35.5  30.0 - 36.0 g/dL   RDW 13.3  11.5 - 15.5 %   Platelets 185  150 - 400 K/uL    No results found.  ROS Blood pressure 114/76, pulse 80, temperature 98.2 F (36.8 C), temperature source Oral, resp. rate 20, height 5' 5"  (1.651 m), weight 121 lb 7.6 oz (55.1 kg), SpO2 99.00%. Physical Exam Alert and oriented. Skin warm and dry. Oral mucosa is moist.   .  Sclera anicteric, conjunctivae is pink. Thyroid not enlarged. No cervical  lymphadenopathy. Lungs clear. Heart regular rate and rhythm.  Abdomen is soft. Bowel sounds are positive. No hepatomegaly. No abdominal masses felt. Tenderness epigastric, left and rt upper quadrant.  No edema to lower extremities.  .  Assessment/Plan: Epigastric and left upper quadrant pain. Suspect this is biliary in nature. SOD. I will discuss with Dr. Laural Golden.  SETZER,TERRI W 07/16/2013, 8:24 AM     GI attending note; Patient interviewed and examined. She presents with over a week history of severe upper abdominal pain associated with intractable nausea but no vomiting. She states she has lost 10 pounds since her symptoms began. Patient was last seen in March 2014 for markedly dilated biliary system and she underwent ERCP with sphincterotomy. She did well for 4-6 months and then has been having intermittent symptoms lasting no more than 2-3 days. Patient went to emergency room at [facility] on 07/09/2013 that she had abdominopelvic CT. Biliary system was dilated CBD size is measured 21 mm.CT also revealed changes of cirrhosis and portal hypertension. She has history of positive HCV antibody.HCVRNA for some reason was not done on her last visit. One has been ordered by Dr. Rexene Alberts. Patient's LFTs on admission were normal except AST of 39 and BP 124. LFTs from this morning are normal. AST 31, ALT 24 and AP 96. Serum lipase on admission was normal.  Abdominal exam reveals a flat abdomen with mild to moderate tenderness in the epigastric region. No organomegaly or masses. Unable to review the CT films through every care.  Assessment; Recurrent epigastric pain and intractable nausea in patient with history of dilated biliary system and sphincterotomy in March last year with a few months of relief. With normal or near normal LFTs I am not convinced that her pain is biliary in origin.  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  She therefore needs to undergo another CT to determine the source of  her symptoms. We'll also repeat her LFTs in a.m.

## 2013-07-16 NOTE — Progress Notes (Signed)
[facility] PROGRESS NOTE  Haley Mathis FAO:[id number] DOB: 09/14/1960 DOA: 07/15/2013 PCP: No PCP Per Patient  Assessment/Plan: 1. Abdominal pain and nausea. Etiology is not entirely clear. Patient has continued symptoms despite medications.  GI has been consulted. There was concern for possible sphincter of Oddi dysfunction.  Discussed with Dr. Karilyn Cota. Will defer further work up to GI.  Will not advance diet for now. Continue ciprofloxacin for now. 2. UTI. Continue ciprofloxacin. Urine culture in process 3. Hypokalemia, improved  Code Status: full code Family Communication: discussed with patient, no family present Disposition Plan: discharge home once improved   Consultants:  Gastroenterology, Dr. Karilyn Cota  Procedures:  none  Antibiotics:  cipro 07/16/13  HPI/Subjective: Continued nausea and abdominal pain.  No bowel movements  Objective: Filed Vitals:   07/16/13 0431  BP: 114/76  Pulse: 80  Temp: 98.2 F (36.8 C)  Resp: 20    Intake/Output Summary (Last 24 hours) at 07/16/13 1444 Last data filed at 07/16/13 0900  Gross per 24 hour  Intake    120 ml  Output      2 ml  Net    118 ml   Filed Weights   07/15/13 1255 07/15/13 1824  Weight: 54.432 kg (120 lb) 55.1 kg (121 lb 7.6 oz)    Exam:   General:  NAD  Cardiovascular: s1, s2, rrr  Respiratory: cta b  Abdomen: soft, diffusely tender, bs+  Musculoskeletal: no edema b/l   Data Reviewed: Basic Metabolic Panel:  Recent Labs Lab 07/15/13 1326 07/16/13 0511  NA 139 138  K 3.4* 4.2  CL 100 103  CO2 27 23  GLUCOSE 106* 80  BUN 7 7  CREATININE 0.72 0.72  CALCIUM 9.9 9.3   Liver Function Tests:  Recent Labs Lab 07/15/13 1326 07/16/13 0511  AST 39* 31  ALT 31 24  ALKPHOS 124* 96  BILITOT 0.4 0.5  PROT 8.7* 7.2  ALBUMIN 4.1 3.4*    Recent Labs Lab 07/15/13 1326  LIPASE 54   No results found for this basename: AMMONIA,  in the last 168 hours CBC:  Recent Labs Lab  07/15/13 1615 07/16/13 0511  WBC 7.2 7.2  NEUTROABS 4.8  --   HGB 12.5 12.4  HCT 34.8* 34.9*  MCV 89.5 89.9  PLT 165 185   Cardiac Enzymes: No results found for this basename: CKTOTAL, CKMB, CKMBINDEX, TROPONINI,  in the last 168 hours BNP (last 3 results) No results found for this basename: PROBNP,  in the last 8760 hours CBG: No results found for this basename: GLUCAP,  in the last 168 hours  No results found for this or any previous visit (from the past 240 hour(s)).   Studies: No results found.  Scheduled Meds: . ciprofloxacin  400 mg Intravenous Q12H  . feeding supplement (RESOURCE BREEZE)  1 Container Oral TID BM  . ondansetron (ZOFRAN) IV  4 mg Intravenous Q6H  . pantoprazole (PROTONIX) IV  40 mg Intravenous Q24H  . traZODone  100 mg Oral QHS   Continuous Infusions: . 0.9 % NaCl with KCl 20 mEq / L 75 mL/hr at 07/16/13 1307    Principal Problem:   Epigastric pain Active Problems:   Hypokalemia   Cirrhosis   Sphincter of Oddi dysfunction   Hepatitis C antibody test positive   UTI (lower urinary tract infection)   Elevated AST (SGOT)    Time spent:    [facility]  [facility] Pager [phone]. If 7PM-7AM, please contact night-coverage at [email or web address],  password [email or web address] 07/16/2013, 2:44 PM  LOS: 1 day

## 2013-07-17 ENCOUNTER — Inpatient Hospital Stay (HOSPITAL_COMMUNITY): Payer: Self-pay

## 2013-07-17 ENCOUNTER — Inpatient Hospital Stay (HOSPITAL_COMMUNITY)
Admit: 2013-07-17 | Discharge: 2013-07-17 | Disposition: A | Discharge status: Home / Self Care | Payer: MEDICAID | Attending: Family Medicine | Admitting: Family Medicine

## 2013-07-17 DIAGNOSIS — R569 Unspecified convulsions: Secondary | ICD-10-CM

## 2013-07-17 LAB — HEPATIC FUNCTION PANEL
ALBUMIN: 3.1 g/dL — AB (ref 3.5–5.2)
ALT: 20 U/L (ref 0–35)
AST: 27 U/L (ref 0–37)
Alkaline Phosphatase: 88 U/L (ref 39–117)
BILIRUBIN TOTAL: 0.4 mg/dL (ref 0.3–1.2)
Bilirubin, Direct: <0.2 mg/dL (ref 0.0–0.3)
TOTAL PROTEIN: 6.6 g/dL (ref 6.0–8.3)

## 2013-07-17 LAB — HCV RNA QUANT
HCV QUANT LOG: 6.53 {Log} — AB (ref <1.18)
HCV QUANT: 3357019 [IU]/mL — AB (ref <15)

## 2013-07-17 MED ORDER — HYOSCYAMINE SULFATE 0.125 MG SL SUBL
0.1250 mg | SUBLINGUAL_TABLET | Freq: Three times a day (TID) | SUBLINGUAL | Status: DC
  Administered 2013-07-17 (×2): 0.125 mg via SUBLINGUAL
  Filled 2013-07-17 (×2): qty 1

## 2013-07-17 MED ORDER — GADOBENATE DIMEGLUMINE 529 MG/ML IV SOLN
10.0000 mL | Freq: Once | INTRAVENOUS | Status: AC | PRN
  Administered 2013-07-17: 10 mL via INTRAVENOUS

## 2013-07-17 NOTE — Progress Notes (Signed)
[facility] PROGRESS NOTE  Marvina Danner Barkan OZH:[id number] DOB: Apr 14, 1961 DOA: 07/15/2013 PCP: No PCP Per Patient  Assessment/Plan: 1. Abdominal pain and nausea. Etiology is not entirely clear. Patient has continued symptoms despite medications.  GI is following. CT scan of the abdomen and pelvis indicate that her common bile duct is reduced in size from prior imaging. This makes the sphincter of oddi dysfunction less likely. Plans are for EGD tomorrow to rule out peptic ulcer disease. Patient's abdominal exam is somewhat inconsistent. She appears to be very concerned regarding her possible discharge home if workup is unrevealing. 2. UTI. Patient was initially started on ciprofloxacin. Urine cultures have shown no growth and therefore antibiotics will be discontinued. 3. Hypokalemia, improved 4. Possible seizures. It is unclear if this was a true seizure. Prior to the event, staff reported that patient indicated that she may develop a seizure due to her insomnia. Appreciate neurology input. Patient has had EEG done with report currently pending. Deferred need for antiepileptic medications to neurology. She does not appear to have any further episodes since.  Code Status: full code Family Communication: discussed with patient, no family present Disposition Plan: discharge home once improved   Consultants:  Gastroenterology, Dr. Karilyn Cota  Procedures:  none  Antibiotics:  cipro 07/16/13  HPI/Subjective: Reports continued nausea and abdominal pain. No vomiting. She is aware regarding plans for EGD tomorrow. She's concerned that she may possibly be discharged if the EGD results are unremarkable.  Objective: Filed Vitals:   07/17/13 0530  BP: 141/83  Pulse: 79  Temp: 99.3 F (37.4 C)  Resp: 20    Intake/Output Summary (Last 24 hours) at 07/17/13 1943 Last data filed at 07/17/13 0600  Gross per 24 hour  Intake 3256.25 ml  Output      0 ml  Net 3256.25 ml   Filed Weights    07/15/13 1255 07/15/13 1824  Weight: 54.432 kg (120 lb) 55.1 kg (121 lb 7.6 oz)    Exam:   General:  NAD  Cardiovascular: s1, s2, rrr  Respiratory: cta b  Abdomen: soft, patient does not appear to show any discomfort on abdomen is palpated with stethoscope. When abdomen is palpated with hand, she grimaces indicating diffuse tenderness. Positive bowel sounds  Musculoskeletal: no edema b/l   Data Reviewed: Basic Metabolic Panel:  Recent Labs Lab 07/15/13 1326 07/16/13 0511  NA 139 138  K 3.4* 4.2  CL 100 103  CO2 27 23  GLUCOSE 106* 80  BUN 7 7  CREATININE 0.72 0.72  CALCIUM 9.9 9.3   Liver Function Tests:  Recent Labs Lab 07/15/13 1326 07/16/13 0511 07/17/13 0508  AST 39* 31 27  ALT 31 24 20   ALKPHOS 124* 96 88  BILITOT 0.4 0.5 0.4  PROT 8.7* 7.2 6.6  ALBUMIN 4.1 3.4* 3.1*    Recent Labs Lab 07/15/13 1326  LIPASE 54   No results found for this basename: AMMONIA,  in the last 168 hours CBC:  Recent Labs Lab 07/15/13 1615 07/16/13 0511  WBC 7.2 7.2  NEUTROABS 4.8  --   HGB 12.5 12.4  HCT 34.8* 34.9*  MCV 89.5 89.9  PLT 165 185   Cardiac Enzymes: No results found for this basename: CKTOTAL, CKMB, CKMBINDEX, TROPONINI,  in the last 168 hours BNP (last 3 results) No results found for this basename: PROBNP,  in the last 8760 hours CBG: No results found for this basename: GLUCAP,  in the last 168 hours  Recent Results (from the past  240 hour(s))  URINE CULTURE     Status: None   Collection Time    07/15/13  1:35 PM      Result Value Range Status   Specimen Description URINE, CLEAN CATCH   Final   Special Requests NONE   Final   Culture  Setup Time     Final   Value: 07/16/2013 00:48     Performed at [facility] Count     Final   Value: NO GROWTH     Performed at [facility]   Culture     Final   Value: NO GROWTH     Performed at [facility]   Report Status 07/16/2013 FINAL   Final      Studies: Ct Abdomen W Contrast  07/16/2013   CLINICAL DATA:  Abdominal pain, dilated common bile duct  EXAM: CT ABDOMEN WITH CONTRAST  TECHNIQUE: Multidetector CT imaging of the abdomen was performed using the standard protocol following bolus administration of intravenous contrast.  CONTRAST:  OMNIPAQUE IOHEXOL 300 MG/ML  SOLN  COMPARISON:  Prior CT abdomen/ pelvis 09/24/2012; ERCP 09/27/2012  FINDINGS: Lower Chest: Respiratory motion limits evaluation for small pulmonary nodules. Trace scarring versus atelectasis in the inferior lingula. Otherwise normal the lower lungs are clear. Visualized cardiac structures within normal limits for size. No pericardial effusion.  Abdomen: Unremarkable CT appearance of the stomach trauma duodenum, spleen, adrenal glands and pancreas. Mild fine nodularity hepatic contours suggest underlying cirrhosis. No discrete hepatic lesion. Focal linear hypoattenuation associated with they hepatic collapsed at the junction of segments 5 and 6 in the right hemi liver remains unchanged dating back to September of 2013 and likely reflects scarring, possibly related to prior cholecystectomy. Diffuse intra hepatic pneumobilia is not unexpected given history of prior sphincterotomy. The gallbladder is surgically absent. The extrahepatic bile duct demonstrates fusiform enlargement extending into the pancreatic head. At the pancreatic head the duct measures 1.5 cm transversely which is improved compared to 2 cm on the prior study. In the hepatic hilum, the duct measures up to 2 cm compared to 3.1 cm on the prior study.  Unremarkable appearance of the bilateral kidneys. No focal solid lesion, hydronephrosis or nephrolithiasis.  Unremarkable visualized bowel. No evidence of obstruction or focal bowel wall thickening. Short segment enteroenteric intussusception in the left upper quadrant noted incidentally. This is likely clinically insignificant. The visualized portion of the appendix is  unremarkable. No free fluid or suspicious adenopathy.  Bones/Soft Tissues: No acute fracture or aggressive appearing lytic or blastic osseous lesion.  Vascular: Atherosclerotic vascular calcifications without aneurysmal dilatation.  IMPRESSION: 1. No acute intra-abdominal or pelvic abnormality. 2. Slight interval improvement in diffuse dilatation of the extrahepatic common bile duct compared to the prior study dated 09/24/2012. In the hepatic hilum, the common bile duct now measures up to 2 cm compared to 3.1 cm previously, and the duct measures 1.5 cm compared to 2 cm at the pancreatic head. 3. 4. Pneumobilia is not unexpected given the history of prior sphincterotomy. This suggests continued patency of the sphincter. 5. No evidence of periportal hepatic edema to suggest cholangitis, and no evidence of hepatic abscess. 6. Cirrhotic morphology of the liver. 7. Additional ancillary findings as above.   Electronically Signed   By: Malachy Moan M.D.   On: 07/16/2013 20:31    Scheduled Meds: . ciprofloxacin  400 mg Intravenous Q12H  . feeding supplement (RESOURCE BREEZE)  1 Container Oral TID BM  .  hyoscyamine  0.125 mg Sublingual TID AC & HS  . ondansetron (ZOFRAN) IV  4 mg Intravenous Q6H  . pantoprazole (PROTONIX) IV  40 mg Intravenous Q24H  . traZODone  100 mg Oral QHS   Continuous Infusions: . 0.9 % NaCl with KCl 20 mEq / L 75 mL/hr at 07/17/13 [id number]    Principal Problem:   Epigastric pain Active Problems:   Hypokalemia   Cirrhosis   Sphincter of Oddi dysfunction   Hepatitis C antibody test positive   UTI (lower urinary tract infection)   Elevated AST (SGOT)    Time spent: 25mins    [facility] Hospital[facility]ON,Shalondra Wunschel  [facility] Pager [phone]. If 7PM-7AM, please contact night-coverage at [email or web address], password [email or web address] 07/17/2013, 7:43 PM  LOS: 2 days

## 2013-07-17 NOTE — Progress Notes (Addendum)
Patient ID: Haley BosSheila M Gibb, female   DOB: 02-Dec-1960, 53 y.o.   MRN: [id number] She says she does not feel good. She cannot sleep. She is afraid she is going to have another seizure. She continues to have pain in her epigastric region. She underwent a CT yesterday which revealed.1. No acute intra-abdominal or pelvic abnormality.  2. Slight interval improvement in diffuse dilatation of the  extrahepatic common bile duct compared to the prior study dated  09/24/2012. In the hepatic hilum, the common bile duct now measures  up to 2 cm compared to 3.1 cm previously, and the duct measures 1.5  cm compared to 2 cm at the pancreatic head.  3.  4. Pneumobilia is not unexpected given the history of prior  sphincterotomy. This suggests continued patency of the sphincter.  5. No evidence of periportal hepatic edema to suggest cholangitis,  and no evidence of hepatic abscess.  6. Cirrhotic morphology of the liver. CMP     Component Value Date/Time   NA 138 07/16/2013 0511   K 4.2 07/16/2013 0511   CL 103 07/16/2013 0511   CO2 23 07/16/2013 0511   GLUCOSE 80 07/16/2013 0511   BUN 7 07/16/2013 0511   CREATININE 0.72 07/16/2013 0511   CALCIUM 9.3 07/16/2013 0511   PROT 6.6 07/17/2013 0508   ALBUMIN 3.1* 07/17/2013 0508   AST 27 07/17/2013 0508   ALT 20 07/17/2013 0508   ALKPHOS 88 07/17/2013 0508   BILITOT 0.4 07/17/2013 0508   GFRNONAA >90 07/16/2013 0511   GFRAA >90 07/16/2013 0511    Hepatic Function Panel     Component Value Date/Time   PROT 6.6 07/17/2013 0508   ALBUMIN 3.1* 07/17/2013 0508   AST 27 07/17/2013 0508   ALT 20 07/17/2013 0508   ALKPHOS 88 07/17/2013 0508   BILITOT 0.4 07/17/2013 0508   BILIDIR <0.2 07/17/2013 0508   IBILI NOT CALCULATED 07/17/2013 0508  Liver enzymes are normal.  Filed Vitals:   07/16/13 0431 07/16/13 2230 07/16/13 2245 07/17/13 0530  BP: 114/76 144/88 152/71 141/83  Pulse: 80 90 84 79  Temp: 98.2 F (36.8 C) 98.5 F (36.9 C)  99.3 F (37.4 C)  TempSrc: Oral Oral   Oral  Resp: 20 20  20   Height:      Weight:      SpO2: 99% 98% 100% 98%   Filed Vitals:   07/16/13 0431 07/16/13 2230 07/16/13 2245 07/17/13 0530  BP: 114/76 144/88 152/71 141/83  Pulse: 80 90 84 79  Temp: 98.2 F (36.8 C) 98.5 F (36.9 C)  99.3 F (37.4 C)  TempSrc: Oral Oral  Oral  Resp: 20 20  20   Height:      Weight:      SpO2: 99% 98% 100% 98%   Abdomen is soft. No guarding. BS+.   Assessment; Epigastric pain and nausea. Hx of dilated biliary system with sphincterotomy in March of last year. LFTs are normal this am. Continues to receive pain medication every 4 hrs.Will continue to monitor. Dr. Karilyn Cotaehman is aware.    GI attending note; Patient remains with intractable nausea and epigastric pain. Patient's LFTs remain normal except serum albumin of 3.1 g. Abdominal pelvic CT last evening showed dilated biliary system but bile duct significantly of smaller caliber compared to study of March 2014. I have discussed patient's condition with Dr. Rockie Neighboursuss [place] [facility]. He does not feel that she needs further evaluation of dilated biliary until she develops elevated  transaminases. Patient apparently had seizure-like activity last night leading to neurology consultation and she is getting EEG. Recommendations; Change Levsin SL to a.c. and each bedtime. EGD in a.m to rule out PUD.

## 2013-07-17 NOTE — Consult Note (Signed)
[place] A. Merlene Laughter, MD     [email or web address]          Haley Mathis is an 53 y.o. female.   ASSESSMENT/PLAN:  Recurrent seizures. The history is very elusive and unreliable but given the recurrent  nature of the seizures, the patient may be epileptic. The patient has an EEG that has been ordered. Repeat brain imaging will be obtained.  Mild cognitive impairment. I suspect this is most likely due to chronic alcoholism. Dementia labs will be obtained.  Marked global brain atrophy likely chronic alcoholism and possible medication effect.  The patient is a 53 year old white female who is hospitalized for abdominal pain. 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 She has been treated by GI service both here and at [facility]. She previously has had a sphincterectomy. Patient apparently had what appears to be a seizure while hospitalized during this admission. The description per the chart however is somewhat suspicious for nonepileptic events. The description is of the patient's flailing around with her arms and legs along with head movements. The patient history changes frequently and is inconsistent from sentence sentence. She initially told me that she has had these spells since she was a child but later reports that she develop seizures at age of 53 years old. She reports that she does not have them frequently but then went on to state that she has them about every couple months or so. She had these for last year. She had one a week ago per the patient. She seemed to endorse a lot of positives when asked at least at times. She does report biting her lip or tongue a few times with these events. However, she denies incontinence of bladder or bowel. There is no history of head injury, meningitis, encephalitis or strokes. There is no family history of seizures. The patient does not report having headaches, shortness of breath, chest pain or GU symptoms.  The review of systems is otherwise negative although unreliable at times. It appears that the patient had a head CT scan done about 8 years ago for seizures. Report indicates marked cerebellar and cerebral atrophy for age. She does admit to a past history of significant alcohol use but she has not consumed any alcohol in 10 years.  GENERAL: This is a thin lady who appears to be in some discomfort from her abdominal pain but in no acute distress.  HEENT: Unremarkable.  ABDOMEN: soft  EXTREMITIES: No edema   BACK: Unremarkable.  SKIN: Normal by inspection.    MENTAL STATUS: Alert and oriented. Speech, language and cognition are generally intact. Judgment and insight limited and unreliable at times.   CRANIAL NERVES: Pupils are equal, round and reactive to light and accommodation; extra ocular movements are full, there is no significant nystagmus; visual fields are full; upper and lower facial muscles are normal in strength and symmetric, there is no flattening of the nasolabial folds; tongue is midline; uvula is midline; shoulder elevation is normal.  MOTOR: Normal tone, bulk and strength; no pronator drift.  COORDINATION: Left finger to nose is normal, right finger to nose is normal, No rest tremor; no intention tremor; no postural tremor; no bradykinesia.  REFLEXES: Deep tendon reflexes are symmetrical and normal. Plantar responses are flexor bilaterally.   SENSATION: Normal to light touch.    Past Medical History  Diagnosis Date  . Osteoarthritis   . Anxiety   . Depression   . Seizures   .  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  . 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 Cirrhosis 09/25/2012  . Hepatitis C antibody test positive 08/2012    Past Surgical History  Procedure Laterality Date  . Tubal ligation    . Colonoscopy w/ polypectomy    . Cholecystectomy    . Ercp N/A 09/27/2012    Procedure: ENDOSCOPIC RETROGRADE  CHOLANGIOPANCREATOGRAPHY (ERCP);  Surgeon: Rogene Houston, MD;  Location: [facility];  Service: Endoscopy;  Laterality: N/A;  . Sphincterotomy N/A 09/27/2012    Procedure: SPHINCTEROTOMY;  Surgeon: Rogene Houston, MD;  Location: [facility];  Service: Endoscopy;  Laterality: N/A;  . Balloon dilation N/A 09/27/2012    Procedure: BALLOON DILATION;  Surgeon: Rogene Houston, MD;  Location: [facility];  Service: Endoscopy;  Laterality: N/A;    History reviewed. No pertinent family history.  Social History:  reports that she has been smoking Cigarettes.  She has been smoking about 1.00 pack per day. She does not have any smokeless tobacco history on file. She reports that she does not drink alcohol or use illicit drugs.  Allergies: No Known Allergies  Medications: Prior to Admission medications   Medication Sig Start Date End Date Taking? Authorizing Provider  HYDROcodone-acetaminophen (NORCO/VICODIN) 5-325 MG per tablet Take 1 tablet by mouth every 6 (six) hours as needed. 07/09/13  Yes Historical Provider, MD  ibuprofen (ADVIL,MOTRIN) 200 MG tablet Take 600 mg by mouth every 8 (eight) hours as needed for moderate pain.   Yes Historical Provider, MD  ondansetron (ZOFRAN ODT) 4 MG disintegrating tablet Take 4 mg by mouth every 8 (eight) hours as needed. 07/09/13  Yes Historical Provider, MD  traZODone (DESYREL) 100 MG tablet Take 100 mg by mouth at bedtime.   Yes Historical Provider, MD     Scheduled Meds: . ciprofloxacin  400 mg Intravenous Q12H  . feeding supplement (RESOURCE BREEZE)  1 Container Oral TID BM  . hyoscyamine  0.125 mg Sublingual TID AC & HS  . ondansetron (ZOFRAN) IV  4 mg Intravenous Q6H  . pantoprazole (PROTONIX) IV  40 mg Intravenous Q24H  . traZODone  100 mg Oral QHS   Continuous Infusions: . 0.9 % NaCl with KCl 20 mEq / L 75 mL/hr at 07/17/13 0202   PRN Meds:.acetaminophen, acetaminophen, albuterol, guaiFENesin-dextromethorphan, HYDROmorphone (DILAUDID) injection, LORazepam,  LORazepam, oxyCODONE, promethazine, zolpidem    Blood pressure 141/83, pulse 79, temperature 99.3 F (37.4 C), temperature source Oral, resp. rate 20, height _0  (1.651 m), weight 55.1 kg (121 lb 7.6 oz), SpO2 98.00%.   Results for orders placed during the hospital encounter of 07/15/13 (from the past 48 hour(s))  COMPREHENSIVE METABOLIC PANEL     Status: Abnormal   Collection Time    07/16/13  5:11 AM      Result Value Range   Sodium 138  137 - 147 mEq/L   Potassium 4.2  3.7 - 5.3 mEq/L   Comment: DELTA CHECK NOTED   Chloride 103  96 - 112 mEq/L   CO2 23  19 - 32 mEq/L   Glucose, Bld 80  70 - 99 mg/dL   BUN 7  6 - 23 mg/dL   Creatinine, Ser 0.72  0.50 - 1.10 mg/dL   Calcium 9.3  8.4 - 10.5 mg/dL   Total Protein 7.2  6.0 - 8.3 g/dL   Albumin 3.4 (*) 3.5 - 5.2 g/dL   AST 31  0 - 37 U/L  ALT 24  0 - 35 U/L   Alkaline Phosphatase 96  39 - 117 U/L   Total Bilirubin 0.5  0.3 - 1.2 mg/dL   GFR calc non Af Amer >90  >90 mL/min   GFR calc Af Amer >90  >90 mL/min   Comment: (NOTE)     The eGFR has been calculated using the CKD EPI equation.     This calculation has not been validated in all clinical situations.     eGFR's persistently <90 mL/min signify possible Chronic Kidney     Disease.  CBC     Status: Abnormal   Collection Time    07/16/13  5:11 AM      Result Value Range   WBC 7.2  4.0 - 10.5 K/uL   RBC 3.88  3.87 - 5.11 MIL/uL   Hemoglobin 12.4  12.0 - 15.0 g/dL   HCT 34.9 (*) 36.0 - 46.0 %   MCV 89.9  78.0 - 100.0 fL   MCH 32.0  26.0 - 34.0 pg   MCHC 35.5  30.0 - 36.0 g/dL   RDW 13.3  11.5 - 15.5 %   Platelets 185  150 - 400 K/uL  HCV RNA QUANT     Status: Abnormal   Collection Time    07/16/13  5:11 AM      Result Value Range   HCV Quantitative [id number] (*) <15 IU/mL   HCV Quantitative Log 6.53 (*) <1.18 log 10   Comment: (NOTE)     This test utilizes the [place] FDA approved Roche HCV Test Kit by RT-PCR.     Performed at [facility]  TSH     Status:  None   Collection Time    07/16/13  5:11 AM      Result Value Range   TSH 1.431  0.350 - 4.500 uIU/mL   Comment: Performed at [facility] PANEL     Status: Abnormal   Collection Time    07/17/13  5:08 AM      Result Value Range   Total Protein 6.6  6.0 - 8.3 g/dL   Albumin 3.1 (*) 3.5 - 5.2 g/dL   AST 27  0 - 37 U/L   ALT 20  0 - 35 U/L   Alkaline Phosphatase 88  39 - 117 U/L   Total Bilirubin 0.4  0.3 - 1.2 mg/dL   Bilirubin, Direct <0.2  0.0 - 0.3 mg/dL   Indirect Bilirubin NOT CALCULATED  0.3 - 0.9 mg/dL    Ct Abdomen W Contrast  07/16/2013   CLINICAL DATA:  Abdominal pain, dilated common bile duct  EXAM: CT ABDOMEN WITH CONTRAST  TECHNIQUE: Multidetector CT imaging of the abdomen was performed using the standard protocol following bolus administration of intravenous contrast.  CONTRAST:  170m OMNIPAQUE IOHEXOL 300 MG/ML  SOLN  COMPARISON:  Prior CT abdomen/ pelvis 09/24/2012; ERCP 09/27/2012  FINDINGS: Lower Chest: Respiratory motion limits evaluation for small pulmonary nodules. Trace scarring versus atelectasis in the inferior lingula. Otherwise normal the lower lungs are clear. Visualized cardiac structures within normal limits for size. No pericardial effusion.  Abdomen: Unremarkable CT appearance of the stomach trauma duodenum, spleen, adrenal glands and pancreas. Mild fine nodularity hepatic contours suggest underlying cirrhosis. No discrete hepatic lesion. Focal linear hypoattenuation associated with they hepatic collapsed at the junction of segments 5 and 6 in the right hemi liver remains unchanged dating back to September of 2013 and likely reflects scarring, possibly related  to prior cholecystectomy. Diffuse intra hepatic pneumobilia is not unexpected given history of prior sphincterotomy. The gallbladder is surgically absent. The extrahepatic bile duct demonstrates fusiform enlargement extending into the pancreatic head. At the pancreatic head the duct  measures 1.5 cm transversely which is improved compared to 2 cm on the prior study. In the hepatic hilum, the duct measures up to 2 cm compared to 3.1 cm on the prior study.  Unremarkable appearance of the bilateral kidneys. No focal solid lesion, hydronephrosis or nephrolithiasis.  Unremarkable visualized bowel. No evidence of obstruction or focal bowel wall thickening. Short segment enteroenteric intussusception in the left upper quadrant noted incidentally. This is likely clinically insignificant. The visualized portion of the appendix is unremarkable. No free fluid or suspicious adenopathy.  Bones/Soft Tissues: No acute fracture or aggressive appearing lytic or blastic osseous lesion.  Vascular: Atherosclerotic vascular calcifications without aneurysmal dilatation.  IMPRESSION: 1. No acute intra-abdominal or pelvic abnormality. 2. Slight interval improvement in diffuse dilatation of the extrahepatic common bile duct compared to the prior study dated 09/24/2012. In the hepatic hilum, the common bile duct now measures up to 2 cm compared to 3.1 cm previously, and the duct measures 1.5 cm compared to 2 cm at the pancreatic head. 3. 4. Pneumobilia is not unexpected given the history of prior sphincterotomy. This suggests continued patency of the sphincter. 5. No evidence of periportal hepatic edema to suggest cholangitis, and no evidence of hepatic abscess. 6. Cirrhotic morphology of the liver. 7. Additional ancillary findings as above.   Electronically Signed   By: Jacqulynn Cadet M.D.   On: 07/16/2013 20:31        Milaya [place] A. Merlene Laughter, M.D.  Diplomate, [employer] of [employer]iatry and Neurology ( Neurology). 07/17/2013, 7:05 PM

## 2013-07-17 NOTE — Progress Notes (Signed)
EEG Completed; Results Pending  

## 2013-07-18 ENCOUNTER — Encounter (HOSPITAL_COMMUNITY): Payer: Self-pay

## 2013-07-18 ENCOUNTER — Encounter (HOSPITAL_COMMUNITY)
Admission: EM | Disposition: A | Discharge status: Home / Self Care | Payer: Self-pay | Source: Home / Self Care | Attending: Internal Medicine

## 2013-07-18 DIAGNOSIS — K766 Portal hypertension: Secondary | ICD-10-CM

## 2013-07-18 DIAGNOSIS — K2990 Gastroduodenitis, unspecified, without bleeding: Secondary | ICD-10-CM

## 2013-07-18 DIAGNOSIS — K2970 Gastritis, unspecified, without bleeding: Secondary | ICD-10-CM

## 2013-07-18 HISTORY — PX: ESOPHAGOGASTRODUODENOSCOPY: SHX5428

## 2013-07-18 LAB — HOMOCYSTEINE: Homocysteine: 6.4 umol/L (ref 4.0–15.4)

## 2013-07-18 LAB — VITAMIN B12: Vitamin B-12: 658 pg/mL (ref 211–911)

## 2013-07-18 LAB — RPR: RPR: NONREACTIVE

## 2013-07-18 LAB — TSH: TSH: 0.544 u[IU]/mL (ref 0.350–4.500)

## 2013-07-18 SURGERY — EGD (ESOPHAGOGASTRODUODENOSCOPY)
Anesthesia: Moderate Sedation

## 2013-07-18 MED ORDER — STERILE WATER FOR IRRIGATION IR SOLN
Status: DC | PRN
  Administered 2013-07-18: 09:00:00

## 2013-07-18 MED ORDER — HYDROCODONE-ACETAMINOPHEN 5-325 MG PO TABS: 1.0000 | ORAL_TABLET | Freq: Four times a day (QID) | ORAL | Status: AC | PRN

## 2013-07-18 MED ORDER — PROMETHAZINE HCL 25 MG RE SUPP: 25.0000 mg | Freq: Four times a day (QID) | RECTAL | Status: AC | PRN

## 2013-07-18 MED ORDER — MIDAZOLAM HCL 5 MG/5ML IJ SOLN
INTRAMUSCULAR | Status: AC
  Filled 2013-07-18: qty 10

## 2013-07-18 MED ORDER — SUCRALFATE 1 GM/10ML PO SUSP
1.0000 g | Freq: Three times a day (TID) | ORAL | Status: DC
  Administered 2013-07-18 (×2): 1 g via ORAL
  Filled 2013-07-18 (×2): qty 10

## 2013-07-18 MED ORDER — MEPERIDINE HCL 50 MG/ML IJ SOLN
INTRAMUSCULAR | Status: AC
  Filled 2013-07-18: qty 1

## 2013-07-18 MED ORDER — MIDAZOLAM HCL 5 MG/5ML IJ SOLN
INTRAMUSCULAR | Status: DC | PRN
Start: 2013-07-18 — End: 2013-07-18
  Administered 2013-07-18 (×2): 2 mg via INTRAVENOUS
  Administered 2013-07-18: 3 mg via INTRAVENOUS

## 2013-07-18 MED ORDER — BUTAMBEN-TETRACAINE-BENZOCAINE 2-2-14 % EX AERO
INHALATION_SPRAY | CUTANEOUS | Status: DC | PRN
  Administered 2013-07-18: 2 via TOPICAL

## 2013-07-18 MED ORDER — LEVETIRACETAM 250 MG PO TABS: 250.0000 mg | ORAL_TABLET | Freq: Two times a day (BID) | ORAL | Status: AC

## 2013-07-18 MED ORDER — SUCRALFATE 1 GM/10ML PO SUSP: 1.0000 g | Freq: Three times a day (TID) | ORAL | Status: AC

## 2013-07-18 MED ORDER — SODIUM CHLORIDE 0.9 % IV SOLN
INTRAVENOUS | Status: DC
  Administered 2013-07-18: 08:00:00 via INTRAVENOUS

## 2013-07-18 MED ORDER — PANTOPRAZOLE SODIUM 40 MG PO TBEC: 40.0000 mg | DELAYED_RELEASE_TABLET | Freq: Every day | ORAL | Status: AC

## 2013-07-18 MED ORDER — MEPERIDINE HCL 50 MG/ML IJ SOLN
INTRAMUSCULAR | Status: DC | PRN
  Administered 2013-07-18 (×2): 25 mg via INTRAVENOUS

## 2013-07-18 MED ORDER — LEVETIRACETAM 500 MG PO TABS
250.0000 mg | ORAL_TABLET | Freq: Two times a day (BID) | ORAL | Status: DC
  Administered 2013-07-18: 250 mg via ORAL
  Filled 2013-07-18: qty 1

## 2013-07-18 NOTE — Discharge Planning (Signed)
Pt stated she was ready to go home and her pain was under control.  Pt has successfully eating 3 regular meals with little to no nausea and no vomitting or pain reported.  Pt's IV was removed and pt was given scripts to go home.  She was also educated on seizure precautions and to call GI doctor for GI future concerns and Neuro doctor for any seizure concerns.  Pt was given DC papers and told about follow up appts.  Pt will be wheeled to car by RN and family once ready.

## 2013-07-18 NOTE — Discharge Summary (Addendum)
Physician Discharge Summary  Haley BradfordSheila M Mathis ZOX:[id number] DOB: 25-Feb-1961 DOA: 07/15/2013  PCP: No PCP Per Patient  Admit date: 07/15/2013 Discharge date: 07/18/2013  Time spent: 45 minutes  Recommendations for Outpatient Follow-up:  1. Follow up with GI in 2 weeks 2. Follow up with neurology in 2 weeks  Discharge Diagnoses:  Principal Problem:   Epigastric pain, possibly related to gastritis, although not entirely clear. Active Problems:   Hypokalemia   Cirrhosis   Sphincter of Oddi dysfunction   Hepatitis C antibody test positive   UTI (lower urinary tract infection)   Elevated AST (SGOT)   Discharge Condition: improved  Diet recommendation: low salt  Filed Weights   07/15/13 1255 07/15/13 1824 07/18/13 0751  Weight: 54.432 kg (120 lb) 55.1 kg (121 lb 7.6 oz) 55.339 kg (122 lb)    History of present illness:  Haley Mathis is a 53 y.o. female with a history of sphincter of Oddi dysfunction, status post sphincterotomy and drainage 3 2014 by Dr. Karilyn Cotaehman, hepatitis C. antibody positivity, and cirrhosis, who presents to the emergency department with a chief complaint of abdominal pain. Her abdominal pain started approximately 2 weeks ago. It has been intermittent up until several days ago when the pain became more constant. She describes the pain as a hurting pain. It is located in the epigastrium with some radiation to the right upper quadrant. She rates the pain an 8/10-10 over 10 in intensity. It is associated with nausea now, but she had multiple episodes of nausea and vomiting several days ago. She has not vomited in 2 days. She ate for breakfast this morning and it stayed down. She also had loose stools "diarrhea" for several days, but she has not had a bowel movement in 24 hours. She denies coffee grounds emesis. She denies black tarry stools. She denies pain with urination. She denies subjective fever, chills, headache, chest pain, shortness of breath, cough, or swelling  in her legs.  Of note, the patient [facility]. A CT scan of her abdomen and pelvis with contrast was ordered and revealed among other things, a cirrhotic liver, cholecystectomy, and market central intra-and extrahepatic biliary ductal dilatation with the common bile duct measuring up to 20 mm.  In the emergency department, she is hemodynamically stable and afebrile. Her lab data are significant for potassium of 3.4, mildly elevated AST of 39, normal lipase of 54, normal WBC of 7.2. Her urinalysis reveals 7-10 WBCs and many bacteria. She is being admitted for further evaluation and management.   Hospital Course:  This patient was admitted to the hospital with vomiting and abdominal pain.  She was seen by gastroenterology and had CT of the abdomen done.  There was no acute findings and previously dilated bile duct that she had sphincterotomy preformed on was improving.  No other acute findings were noted.  She then underwent EGD which showed some mild gastritis.  Her abdominal pain was inconsistent and clinical exam findings were not reproducible.  She was started on protonix and carafate and her diet was advanced.  Today she tolerated a regular diet and did not have any vomiting or complaints of abdominal pain. It was noted that she had cirrhosis on imaging and HCV RNA was found to be elevated.  She will follow up with GI to consider treatment as an outpatient in the future.  During her hospital stay, she did have a possible seizure episode.  She  was seen by neurology and MRI brain and EEG was done, both of which were unremarkable. Due to her possible history of seizures, neurology elected to start the patient on low dose keppra. She has been cleared for discharge by neurology.  Procedures: EGD: Mild changes of portal gastropathy but no evidence of esophageal or gastric varices.  Nonerosive antral gastritis.  ZOX:WRUE is a  normal recording of the awake and drowsy states.   Consultations:  Neurology  Gastroenterology  Discharge Exam: Filed Vitals:   07/18/13 1534  BP: 153/75  Pulse: 75  Temp: 98.8 F (37.1 C)  Resp: 20    General: NAD Cardiovascular: S1, S2 RRR Respiratory: CTA B  Discharge Instructions  Discharge Orders   Future Orders Complete By Expires   Call MD for:  persistant nausea and vomiting  As directed    Call MD for:  severe uncontrolled pain  As directed    Diet - low sodium heart healthy  As directed    Increase activity slowly  As directed        Medication List    STOP taking these medications       ibuprofen 200 MG tablet  Commonly known as:  ADVIL,MOTRIN      TAKE these medications       HYDROcodone-acetaminophen 5-325 MG per tablet  Commonly known as:  NORCO/VICODIN  Take 1 tablet by mouth every 6 (six) hours as needed.     levETIRAcetam 250 MG tablet  Commonly known as:  KEPPRA  Take 1 tablet (250 mg total) by mouth 2 (two) times daily.     pantoprazole 40 MG tablet  Commonly known as:  PROTONIX  Take 1 tablet (40 mg total) by mouth daily.     promethazine 25 MG suppository  Commonly known as:  PHENERGAN  Place 1 suppository (25 mg total) rectally every 6 (six) hours as needed for nausea or vomiting.     sucralfate 1 GM/10ML suspension  Commonly known as:  CARAFATE  Take 10 mLs (1 g total) by mouth 4 (four) times daily -  with meals and at bedtime.     traZODone 100 MG tablet  Commonly known as:  DESYREL  Take 100 mg by mouth at bedtime.     ZOFRAN ODT 4 MG disintegrating tablet  Generic drug:  ondansetron  Take 4 mg by mouth every 8 (eight) hours as needed.       No Known Allergies     Follow-up Information   Follow up with REHMAN,NAJEEB U, MD. Schedule an appointment as soon as possible for a visit in 2 weeks.   Specialty:  Gastroenterology   Contact information:   [address], [address] [place] [address] [phone]        Follow up with [facility], KOFI, MD. Schedule an appointment as soon as possible for a visit in 4 weeks.   Specialty:  Neurology   Contact information:   [address] [place] [address] [phone]        The results of significant diagnostics from this hospitalization (including imaging, microbiology, ancillary and laboratory) are listed below for reference.    Significant Diagnostic Studies: Ct Abdomen W Contrast  07/16/2013   CLINICAL DATA:  Abdominal pain, dilated common bile duct  EXAM: CT ABDOMEN WITH CONTRAST  TECHNIQUE: Multidetector CT imaging of the abdomen was performed using the standard protocol following bolus administration of intravenous contrast.  CONTRAST:  OMNIPAQUE IOHEXOL 300 MG/ML  SOLN  COMPARISON:  Prior CT abdomen/ pelvis 09/24/2012; ERCP 09/27/2012  FINDINGS: Lower Chest: Respiratory motion limits evaluation for small pulmonary nodules. Trace scarring versus atelectasis in the inferior lingula. Otherwise normal the lower lungs are clear. Visualized cardiac structures within normal limits for size. No pericardial effusion.  Abdomen: Unremarkable CT appearance of the stomach trauma duodenum, spleen, adrenal glands and pancreas. Mild fine nodularity hepatic contours suggest underlying cirrhosis. No discrete hepatic lesion. Focal linear hypoattenuation associated with they hepatic collapsed at the junction of segments 5 and 6 in the right hemi liver remains unchanged dating back to September of 2013 and likely reflects scarring, possibly related to prior cholecystectomy. Diffuse intra hepatic pneumobilia is not unexpected given history of prior sphincterotomy. The gallbladder is surgically absent. The extrahepatic bile duct demonstrates fusiform enlargement extending into the pancreatic head. At the pancreatic head the duct measures 1.5 cm transversely which is improved compared to 2 cm on the prior study. In the hepatic hilum, the duct measures up to 2 cm  compared to 3.1 cm on the prior study.  Unremarkable appearance of the bilateral kidneys. No focal solid lesion, hydronephrosis or nephrolithiasis.  Unremarkable visualized bowel. No evidence of obstruction or focal bowel wall thickening. Short segment enteroenteric intussusception in the left upper quadrant noted incidentally. This is likely clinically insignificant. The visualized portion of the appendix is unremarkable. No free fluid or suspicious adenopathy.  Bones/Soft Tissues: No acute fracture or aggressive appearing lytic or blastic osseous lesion.  Vascular: Atherosclerotic vascular calcifications without aneurysmal dilatation.  IMPRESSION: 1. No acute intra-abdominal or pelvic abnormality. 2. Slight interval improvement in diffuse dilatation of the extrahepatic common bile duct compared to the prior study dated 09/24/2012. In the hepatic hilum, the common bile duct now measures up to 2 cm compared to 3.1 cm previously, and the duct measures 1.5 cm compared to 2 cm at the pancreatic head. 3. 4. Pneumobilia is not unexpected given the history of prior sphincterotomy. This suggests continued patency of the sphincter. 5. No evidence of periportal hepatic edema to suggest cholangitis, and no evidence of hepatic abscess. 6. Cirrhotic morphology of the liver. 7. Additional ancillary findings as above.   Electronically Signed   By: Malachy Moan M.D.   On: 07/16/2013 20:31   Mr Laqueta Jean RU Contrast  07/17/2013   CLINICAL DATA:  Possible seizure  EXAM: MRI HEAD WITHOUT AND WITH CONTRAST  TECHNIQUE: Multiplanar, multiecho pulse sequences of the brain and surrounding structures were obtained without and with intravenous contrast.  CONTRAST:  10mL MULTIHANCE GADOBENATE DIMEGLUMINE 529 MG/ML IV SOLN  COMPARISON:  Prior CT from 10/10/2004  FINDINGS: Multiple scattered and confluent T2/FLAIR hyperintensities seen within the periventricular and deep white matter are present, likely related to moderate chronic  microvascular ischemic changes. A more prominent 7 mm hyperintense T2/FLAIR focus with corresponding hypointense T1 signal located within the right centrum semi ovale is suggestive of a small remote lacunar infarct. Diffuse prominence of the CSF containing spaces is most compatible with generalized cerebral atrophy, greatest within the mid frontal lobes and cerebellar vermis.  The craniocervical junction is within normal limits. Pituitary gland is normal. The globes and optic nerves demonstrate a normal appearance with normal signal intensity.  No mass lesion or midline shift. Ventricles size is within normal limits without evidence of hydrocephalus. No extra-axial fluid collection. The hippocampi are symmetric in size and appearance without abnormal signal intensity.  No diffusion-weighted signal abnormality is identified to suggest acute intracranial infarct. There is no intracranial hemorrhage.  Normal flow voids seen within the intracranial vasculature. Gray-white matter differentiation is maintained.  No abnormal enhancement.  A well-circumscribed ovoid lesion measuring 1.4 x 1.4 x 2.2 cm located along the midline in the posterior nasopharynx demonstrates hyperintense T1 signal with hypo intense T2 signal, with mildly elevated FLAIR signal intensity. No associated enhancement. Finding may represent a benign retention cyst or possibly a benign developmental Tornwaldt cyst.  Calvarium demonstrates a normal appearance with normal signal intensity. Paranasal sinuses are clear. No mastoid effusion.  IMPRESSION: 1. No acute intracranial infarct or other abnormality identified. 2. Moderate generalized cerebral atrophy, greatest within the frontal lobes bilaterally. 3. Scattered and confluent T2/FLAIR hyperintensity within the periventricular and deep white matter, most compatible with moderate chronic microvascular ischemic changes. 4. Probable remote 7 mm lacunar infarct within the right centrum semi ovale. 5.  Well-circumscribed T1 hyperintense nonenhancing lesion along the midline in the posterior nasopharynx. Differential considerations include a benign retention cyst or possible proteinaceous Thornwaldt cyst.   Electronically Signed   By: Rise Mu M.D.   On: 07/17/2013 22:41    Microbiology: Recent Results (from the past 240 hour(s))  URINE CULTURE     Status: None   Collection Time    07/15/13  1:35 PM      Result Value Range Status   Specimen Description URINE, CLEAN CATCH   Final   Special Requests NONE   Final   Culture  Setup Time     Final   Value: 07/16/2013 00:48     Performed at [facility] Count     Final   Value: NO GROWTH     Performed at [facility]   Culture     Final   Value: NO GROWTH     Performed at [facility]   Report Status 07/16/2013 FINAL   Final     Labs: Basic Metabolic Panel:  Recent Labs Lab 07/15/13 1326 07/16/13 0511  NA 139 138  K 3.4* 4.2  CL 100 103  CO2 27 23  GLUCOSE 106* 80  BUN 7 7  CREATININE 0.72 0.72  CALCIUM 9.9 9.3   Liver Function Tests:  Recent Labs Lab 07/15/13 1326 07/16/13 0511 07/17/13 0508  AST 39* 31 27  ALT 31 24 20   ALKPHOS 124* 96 88  BILITOT 0.4 0.5 0.4  PROT 8.7* 7.2 6.6  ALBUMIN 4.1 3.4* 3.1*    Recent Labs Lab 07/15/13 1326  LIPASE 54   No results found for this basename: AMMONIA,  in the last 168 hours CBC:  Recent Labs Lab 07/15/13 1615 07/16/13 0511  WBC 7.2 7.2  NEUTROABS 4.8  --   HGB 12.5 12.4  HCT 34.8* 34.9*  MCV 89.5 89.9  PLT 165 185   Cardiac Enzymes: No results found for this basename: CKTOTAL, CKMB, CKMBINDEX, TROPONINI,  in the last 168 hours BNP: BNP (last 3 results) No results found for this basename: PROBNP,  in the last 8760 hours CBG: No results found for this basename: GLUCAP,  in the last 168 hours     Signed:  MEMON,JEHANZEB  [facility] 07/18/2013, 4:44 PM

## 2013-07-18 NOTE — Op Note (Signed)
EGD PROCEDURE REPORT  PATIENT:  Haley Mathis  MR#:  [id number] Birthdate:  April 09, 1961, 53 y.o., female Endoscopist:  Dr. Malissa HippoNajeeb U. Kayvon Mo, MD Referred By:  Dr. Erick BlinksJehanzeb Memon, MD Procedure Date: 07/18/2013  Procedure:   EGD  Indications:  Patient is 53 year old Caucasian female with multiple medical problems who now presents with epigastric pain and nausea. She has history of papillary stenosis for which she underwent a recent drop in March last year. Bile duct then was 31 mm in the 20 mm in her transaminases are normal. Therefore her pain is not felt to be of biliary origin. She also has cirrhosis secondary to hepatitis C.           Informed Consent:  The risks, benefits, alternatives & imponderables which include, but are not limited to, bleeding, infection, perforation, drug reaction and potential missed lesion have been reviewed.  The potential for biopsy, lesion removal, esophageal dilation, etc. have also been discussed.  Questions have been answered.  All parties agreeable.  Please see history & physical in medical record for more information.  Medications:  Demerol 50 mg IV Versed 8 mg IV Cetacaine spray topically for oropharyngeal anesthesia  Description of procedure:  The endoscope was introduced through the mouth and advanced to the second portion of the duodenum without difficulty or limitations. The mucosal surfaces were surveyed very carefully during advancement of the scope and upon withdrawal.  Findings:  Esophagus:  Mucosa of the esophagus was normal. No insufficient varices noted. GE junction was unremarkable. GEJ:  39 cm Stomach:  Moderate amount of bile noted in the stomach but there was no food debris. Stomach distended very well with insufflation. Folds in the proximal stomach were normal. Examination of mucosa revealed mosaic pattern. There was patchy erythema and edema to antral mucosa but no erosions or ulcers noted. Pyloric channel was patent. Angularis was  unremarkable. No fundal varices noted. Duodenum:  Normal bulbar and post bulbar mucosa.  Therapeutic/Diagnostic Maneuvers Performed:  None  Complications:  None  Impression: Mild changes of portal gastropathy but no evidence of esophageal or gastric varices. Nonerosive antral gastritis.  Recommendations:  Sucralfate 1 g by mouth a.c. And qhs. H. pylori serology. Discontinue hyoscyamine.  Nakai Yard U  07/18/2013  8:49 AM  CC: Dr. Bonnetta BarryNo PCP Per Patient & Dr. No ref. provider found

## 2013-07-18 NOTE — Procedures (Signed)
[facility] Marcia Lepera A. Gerilyn Pilgrimoonquah, MD     [email or web address]          FACILITY: [facility]  PHYSICIAN: Estevan Kersh A. Gerilyn Pilgrimoonquah, M.D.  DATE OF PROCEDURE: 07/17/2013 EEG INTERPRETATION  INDICATION: This is a 53 year old female who presents with convulsion suspicious for epileptic seizures. 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  MEDICATIONS:  Scheduled Meds: . feeding supplement (RESOURCE BREEZE)  1 Container Oral TID BM  . meperidine      . midazolam      . ondansetron (ZOFRAN) IV  4 mg Intravenous Q6H  . pantoprazole (PROTONIX) IV  40 mg Intravenous Q24H  . sucralfate  1 g Oral TID WC & HS  . traZODone  100 mg Oral QHS   Continuous Infusions: . 0.9 % NaCl with KCl 20 mEq / L 75 mL/hr at 07/17/13 1923   PRN Meds:.acetaminophen, acetaminophen, albuterol, guaiFENesin-dextromethorphan, HYDROmorphone (DILAUDID) injection, LORazepam, LORazepam, oxyCODONE, promethazine, zolpidem   ANALYSIS: A 16-channel recording using standard 10/20 measurements is conducted for 20 minutes. There is a well-formed posterior dominant rhythm of 11 Hz which attenuates with eye opening. There is beta activity observed in the frontal areas. Awake and drowsy activities are recorded. Photic stimulation is carried out without abnormal changes in the background activity. There is no focal or lateralized slowing. There is no epileptiform activity observed.   IMPRESSION:  This is a normal recording of the awake and drowsy states.  Izabelle Daus A. Gerilyn Pilgrimoonquah, M.D. Diplomate, Biomedical engineerAmerican Board of Psychiatry and Neurology ( Neurology).

## 2013-07-18 NOTE — Progress Notes (Signed)
Patient ID: Haley Mathis, female   DOB: [birth year], 53 y.o.   MRN: [id number]  [facility] Haley Liszewski A. Haley Pilgrimoonquah, MD     [email or web address]          [name] is an 53 y.o. female.   Assessment/Plan: 1. I suspect that the patient does have grand mal seizures. We will treat the patient with seizure medication despite a normal EEG and unremarkable imaging. We will treat the patient empirically with Keppra which now has generic and should not be very expensive.  2. Moderate cerebral atrophy and chronic ischemic changes.   I did get a better history from the patient today. She seems a lot more pleasant and the with it. She does indicate again that she has had about 3 or 4 seizures over the last 12 months. She reports that she has grand mal seizures with biting of her lip and tongue although she denies bladder or bowel incontinence. She reports that her abdominal pain has improved significant relief. The nurse reports that she did have a normal diet today and did well with this.  The patient is awake and alert. She is lucid and coherent today and seems more pleasant. Pupils are reactive. Facial strength symmetric. Extraocular movements are intact. She moves both sides well.     Objective: Vital signs in last 24 hours: Temp:  [98 F (36.7 C)-98.6 F (37 C)] 98.6 F (37 C) (01/16 0751) Pulse Rate:  [66-99] 82 (01/16 0850) Resp:  [13-21] 21 (01/16 0850) BP: (118-149)/(69-78) 140/72 mmHg (01/16 0850) SpO2:  [96 %-100 %] 97 % (01/16 0850) Weight:  [55.339 kg (122 lb)] 55.339 kg (122 lb) (01/16 0751)  Intake/Output from previous day: 01/15 0701 - 01/16 0700 In: 1800 [I.V.:1800] Out: -  Intake/Output this shift:   Nutritional status: General   Lab Results: Results for orders placed during the hospital encounter of 07/15/13 (from the past 48 hour(s))  HEPATIC FUNCTION PANEL     Status: Abnormal   Collection Time    07/17/13  5:08 AM      Result Value Range   Total Protein 6.6  6.0 - 8.3 g/dL   Albumin 3.1 (*) 3.5 - 5.2 g/dL   AST 27  0 - 37 U/L   ALT 20  0 - 35 U/L   Alkaline Phosphatase 88  39 - 117 U/L   Total Bilirubin 0.4  0.3 - 1.2 mg/dL   Bilirubin, Direct <4.0<0.2  0.0 - 0.3 mg/dL   Indirect Bilirubin NOT CALCULATED  0.3 - 0.9 mg/dL    Lipid Panel No results found for this basename: CHOL, TRIG, HDL, CHOLHDL, VLDL, LDLCALC,  in the last 72 hours  Studies/Results: Ct Abdomen W Contrast  07/16/2013   CLINICAL DATA:  Abdominal pain, dilated common bile duct  EXAM: CT ABDOMEN WITH CONTRAST  TECHNIQUE: Multidetector CT imaging of the abdomen was performed using the standard protocol following bolus administration of intravenous contrast.  CONTRAST:  100mL OMNIPAQUE IOHEXOL 300 MG/ML  SOLN  COMPARISON:  Prior CT abdomen/ pelvis 09/24/2012; ERCP 09/27/2012  FINDINGS: Lower Chest: Respiratory motion limits evaluation for small pulmonary nodules. Trace scarring versus atelectasis in the inferior lingula. Otherwise normal the lower lungs are clear. Visualized cardiac structures within normal limits for size. No pericardial effusion.  Abdomen: Unremarkable CT appearance of the stomach trauma duodenum, spleen, adrenal glands and pancreas. Mild fine nodularity hepatic contours suggest underlying cirrhosis. No discrete hepatic lesion. Focal linear hypoattenuation associated with they hepatic collapsed at the  junction of segments 5 and 6 in the right hemi liver remains unchanged dating back to September of 2013 and likely reflects scarring, possibly related to prior cholecystectomy. Diffuse intra hepatic pneumobilia is not unexpected given history of prior sphincterotomy. The gallbladder is surgically absent. The extrahepatic bile duct demonstrates fusiform enlargement extending into the pancreatic head. At the pancreatic head the duct measures 1.5 cm transversely which is improved compared to 2 cm on the prior study. In the hepatic hilum, the duct measures up to 2 cm  compared to 3.1 cm on the prior study.  Unremarkable appearance of the bilateral kidneys. No focal solid lesion, hydronephrosis or nephrolithiasis.  Unremarkable visualized bowel. No evidence of obstruction or focal bowel wall thickening. Short segment enteroenteric intussusception in the left upper quadrant noted incidentally. This is likely clinically insignificant. The visualized portion of the appendix is unremarkable. No free fluid or suspicious adenopathy.  Bones/Soft Tissues: No acute fracture or aggressive appearing lytic or blastic osseous lesion.  Vascular: Atherosclerotic vascular calcifications without aneurysmal dilatation.  IMPRESSION: 1. No acute intra-abdominal or pelvic abnormality. 2. Slight interval improvement in diffuse dilatation of the extrahepatic common bile duct compared to the prior study dated 09/24/2012. In the hepatic hilum, the common bile duct now measures up to 2 cm compared to 3.1 cm previously, and the duct measures 1.5 cm compared to 2 cm at the pancreatic head. 3. 4. Pneumobilia is not unexpected given the history of prior sphincterotomy. This suggests continued patency of the sphincter. 5. No evidence of periportal hepatic edema to suggest cholangitis, and no evidence of hepatic abscess. 6. Cirrhotic morphology of the liver. 7. Additional ancillary findings as above.   Electronically Signed   By: Malachy Moan M.D.   On: 07/16/2013 20:31   Mr Laqueta Jean ZO Contrast  07/17/2013   CLINICAL DATA:  Possible seizure  EXAM: MRI HEAD WITHOUT AND WITH CONTRAST  TECHNIQUE: Multiplanar, multiecho pulse sequences of the brain and surrounding structures were obtained without and with intravenous contrast.  CONTRAST:  10mL MULTIHANCE GADOBENATE DIMEGLUMINE 529 MG/ML IV SOLN  COMPARISON:  Prior CT from 10/10/2004  FINDINGS: Multiple scattered and confluent T2/FLAIR hyperintensities seen within the periventricular and deep white matter are present, likely related to moderate chronic  microvascular ischemic changes. A more prominent 7 mm hyperintense T2/FLAIR focus with corresponding hypointense T1 signal located within the right centrum semi ovale is suggestive of a small remote lacunar infarct. Diffuse prominence of the CSF containing spaces is most compatible with generalized cerebral atrophy, greatest within the mid frontal lobes and cerebellar vermis.  The craniocervical junction is within normal limits. Pituitary gland is normal. The globes and optic nerves demonstrate a normal appearance with normal signal intensity.  No mass lesion or midline shift. Ventricles size is within normal limits without evidence of hydrocephalus. No extra-axial fluid collection. The hippocampi are symmetric in size and appearance without abnormal signal intensity.  No diffusion-weighted signal abnormality is identified to suggest acute intracranial infarct. There is no intracranial hemorrhage. Normal flow voids seen within the intracranial vasculature. Gray-white matter differentiation is maintained.  No abnormal enhancement.  A well-circumscribed ovoid lesion measuring 1.4 x 1.4 x 2.2 cm located along the midline in the posterior nasopharynx demonstrates hyperintense T1 signal with hypo intense T2 signal, with mildly elevated FLAIR signal intensity. No associated enhancement. Finding may represent a benign retention cyst or possibly a benign developmental Tornwaldt cyst.  Calvarium demonstrates a normal appearance with normal signal intensity. Paranasal sinuses are clear. No  mastoid effusion.  IMPRESSION: 1. No acute intracranial infarct or other abnormality identified. 2. Moderate generalized cerebral atrophy, greatest within the frontal lobes bilaterally. 3. Scattered and confluent T2/FLAIR hyperintensity within the periventricular and deep white matter, most compatible with moderate chronic microvascular ischemic changes. 4. Probable remote 7 mm lacunar infarct within the right centrum semi ovale. 5.  Well-circumscribed T1 hyperintense nonenhancing lesion along the midline in the posterior nasopharynx. Differential considerations include a benign retention cyst or possible proteinaceous Thornwaldt cyst.   Electronically Signed   By: Rise Mu M.D.   On: 07/17/2013 22:41    Medications:  Scheduled Meds: . feeding supplement (RESOURCE BREEZE)  1 Container Oral TID BM  . meperidine      . midazolam      . ondansetron (ZOFRAN) IV  4 mg Intravenous Q6H  . pantoprazole (PROTONIX) IV  40 mg Intravenous Q24H  . sucralfate  1 g Oral TID WC & HS  . traZODone  100 mg Oral QHS   Continuous Infusions: . 0.9 % NaCl with KCl 20 mEq / L 75 mL/hr at 07/17/13 1923   PRN Meds:.acetaminophen, acetaminophen, albuterol, guaiFENesin-dextromethorphan, HYDROmorphone (DILAUDID) injection, LORazepam, LORazepam, oxyCODONE, promethazine, zolpidem     LOS: 3 days   Meiling Hendriks A. Haley Pilgrim, M.D.  Diplomate, Biomedical engineer of Psychiatry and Neurology ( Neurology).

## 2013-07-21 LAB — H. PYLORI ANTIBODY, IGG: H Pylori IgG: <0.4 {ISR}

## 2013-07-22 ENCOUNTER — Encounter (HOSPITAL_COMMUNITY): Payer: Self-pay | Admitting: Internal Medicine

## 2013-08-05 ENCOUNTER — Ambulatory Visit (INDEPENDENT_AMBULATORY_CARE_PROVIDER_SITE_OTHER): Payer: Self-pay | Admitting: Internal Medicine

## 2013-11-21 ENCOUNTER — Emergency Department (HOSPITAL_COMMUNITY)
Admission: EM | Admit: 2013-11-21 | Discharge: 2013-11-21 | Disposition: A | Discharge status: Home / Self Care | Payer: Self-pay

## 2013-11-21 NOTE — ED Notes (Signed)
Pt has gotten out of wheelchair into the floor x 2 since arrival. PT is alert, skin is dry with normal color, hx of abdominal pain, pt is not vomiting. Pt informed that there will be a wait. Registration clerk stated that pt said she could not wait that long when she initially registered.

## 2014-04-23 IMAGING — CT CT ABD-PELV W/ CM
2 of 5 series · 15 of 46 positions shown, 17 images · IV contrast (Omnipaque 300)
Comparison: 03/12/2012 CT.

***ADDENDUM*** CREATED: 09/24/2012 [DATE]

Conclusion number four should read hepatic cirrhosis and
splenomegaly.  No change from prior.  Findings suggest chronic
portal venous hypertension.
***END ADDENDUM*** SIGNED BY: Holiber Balandra, M.D.
CLINICAL DATA: Nausea and vomiting for 3 days.  Nonspecific
abdominal pain.  Back pain.  History of seizures.
CT ABDOMEN AND PELVIS WITH CONTRAST
TECHNIQUE: Multidetector CT imaging of the abdomen and pelvis was
performed following the standard protocol during bolus
administration of intravenous contrast.
Contrast: 50mL OMNIPAQUE IOHEXOL 300 MG/ML  SOLN, 100mL OMNIPAQUE
IOHEXOL 300 MG/ML  SOLN

[Series 2: abd_pel_with 5.0 b40f · axial · 0.67mm/px · z∈[-410,-16]mm · 12 of 89 slices shown, 14 images]
[im 5/89  soft-tissue]
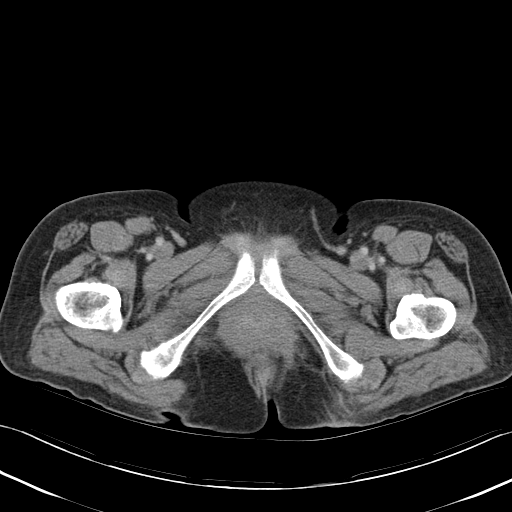
[im 5/89  bone]
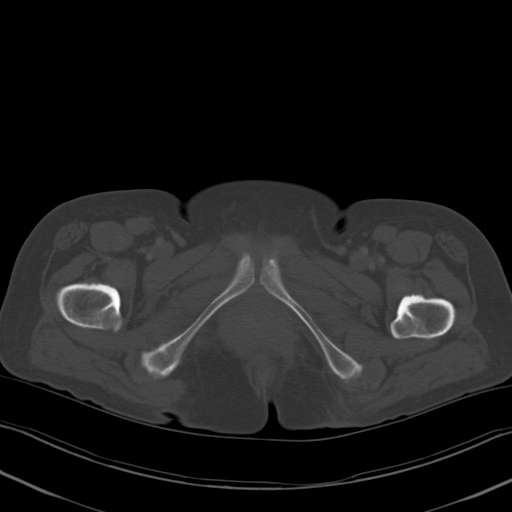
[im 14/89  soft-tissue]
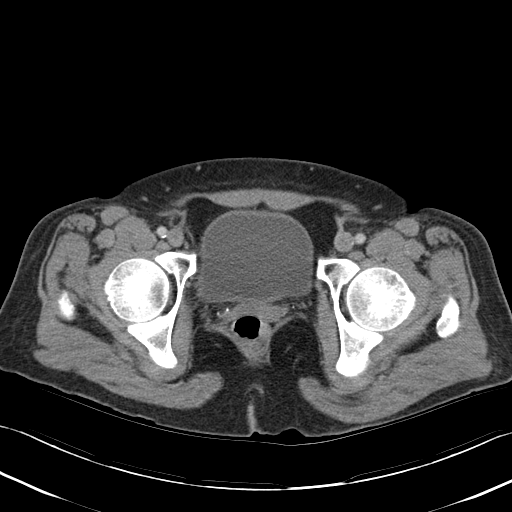
[im 19/89  soft-tissue]
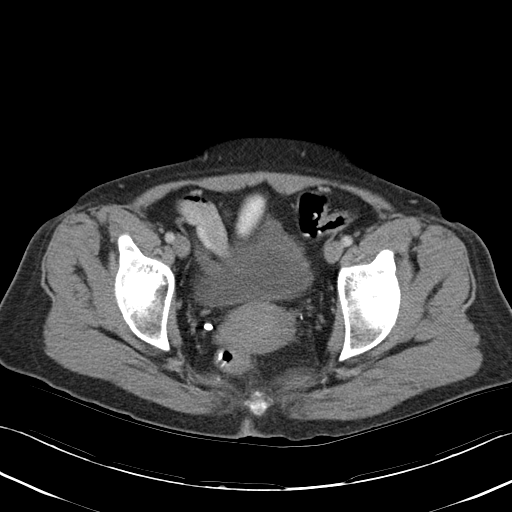
[im 28/89  soft-tissue]
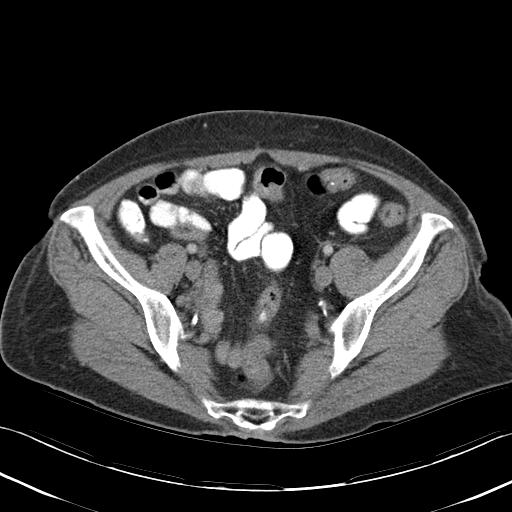
[im 33/89  soft-tissue]
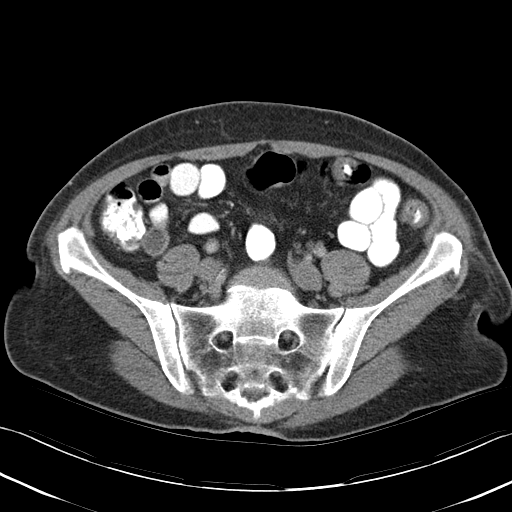
[im 42/89  soft-tissue]
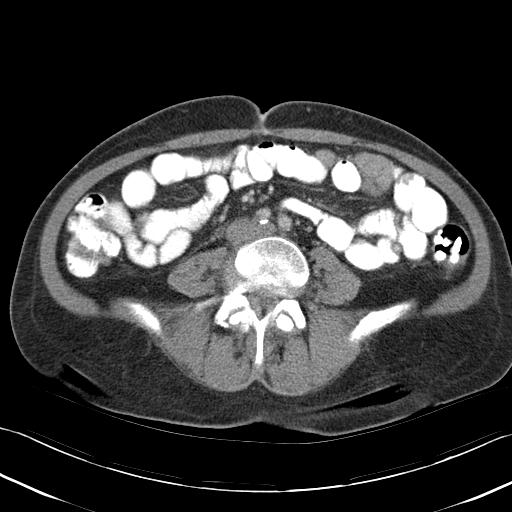
[im 47/89  soft-tissue]
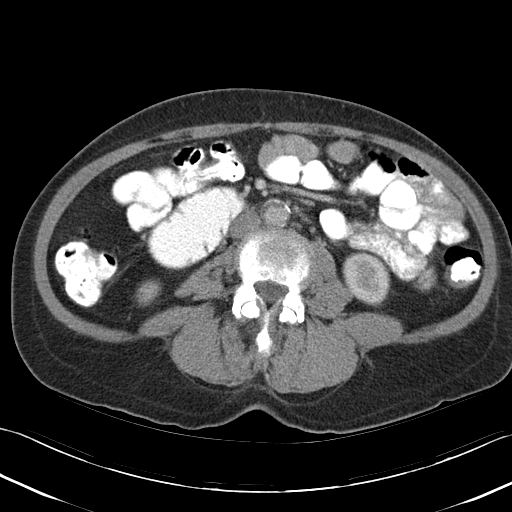
[im 56/89  soft-tissue]
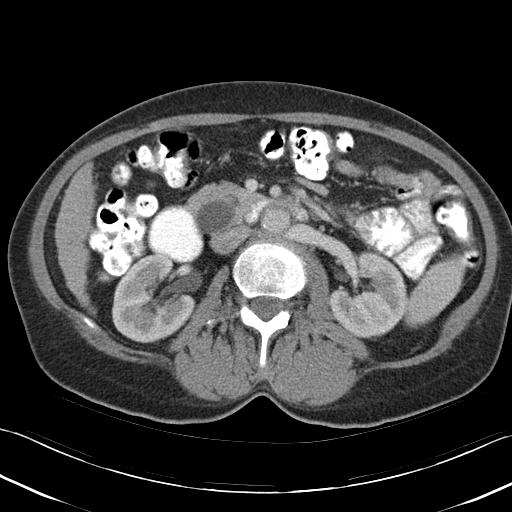
[im 61/89  soft-tissue]
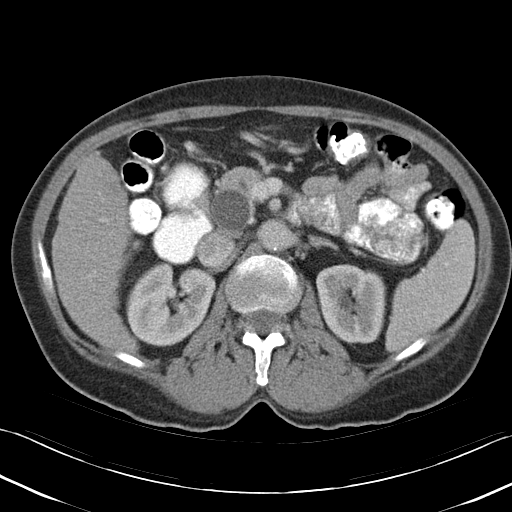
[im 61/89  bone]
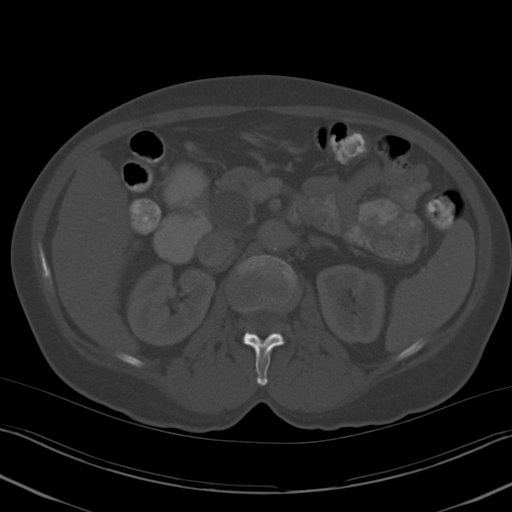
[im 70/89  soft-tissue]
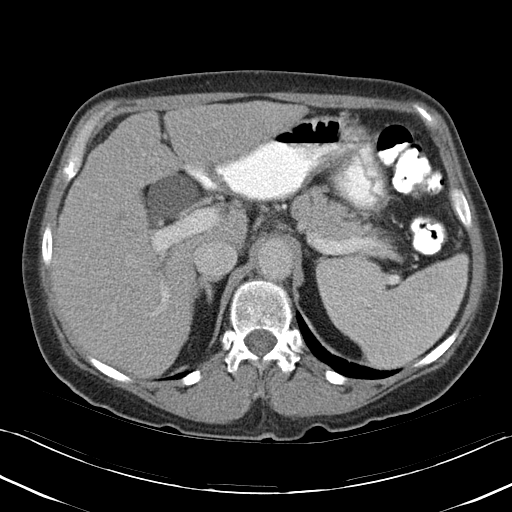
[im 75/89  soft-tissue]
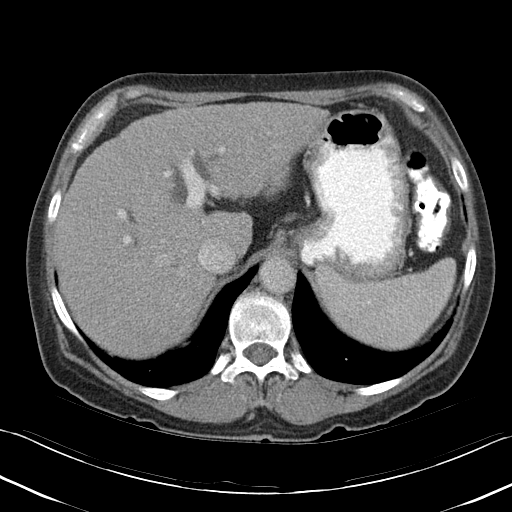
[im 84/89  soft-tissue]
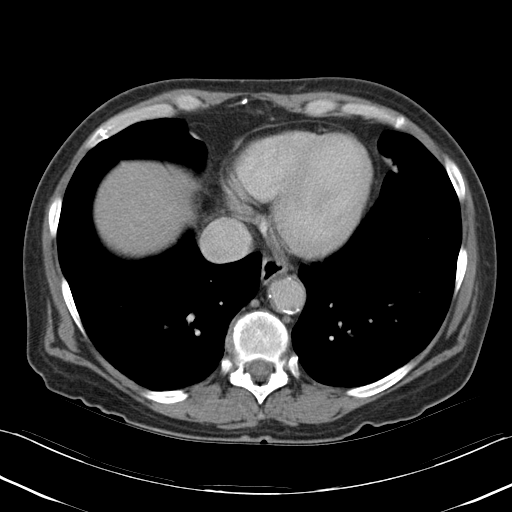

[Series 4: abd_pel_with 3.0 spo cor · coronal · 0.67mm/px · 3 of 75 slices shown]
[im 25/75  soft-tissue]
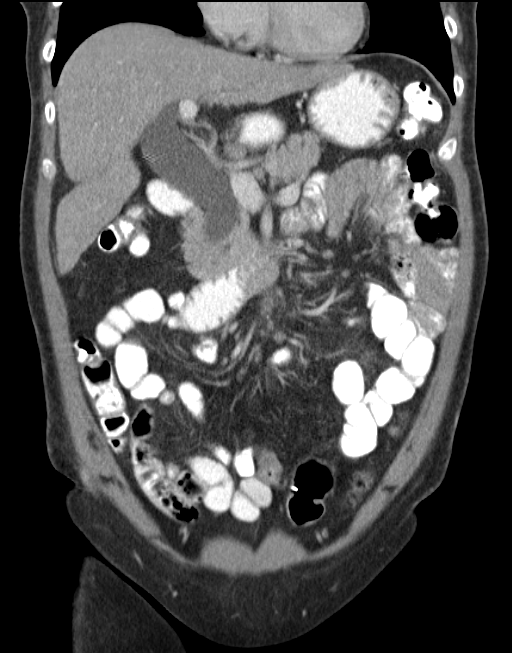
[im 33/75  soft-tissue]
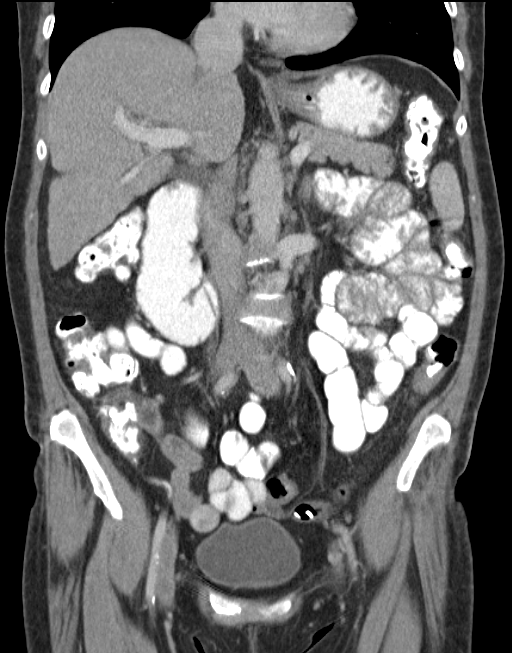
[im 42/75  soft-tissue]
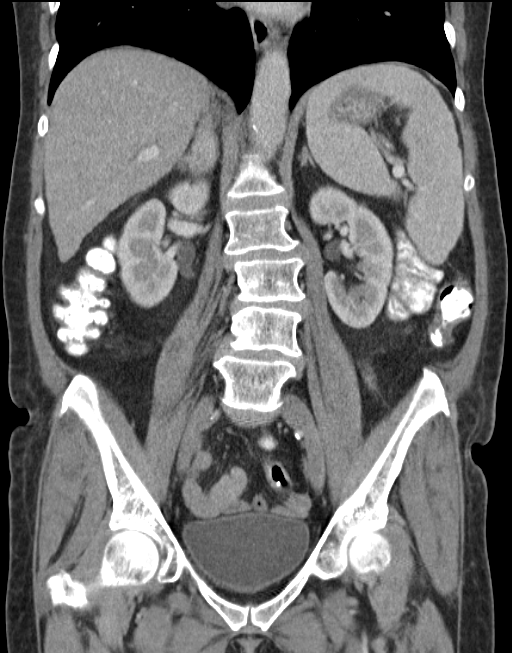

[15 of 46 positions shown; findings below may reference images not displayed]

FINDINGS: Lung Bases: Mild dependent atelectasis.  Scarring in the
lingula is unchanged.  Visualized portions of the heart grossly
normal.  Descending thoracic aortic atherosclerosis.

Liver:  Mild intrahepatic biliary ductal dilation.  Nodular contour
of the liver compatible with hepatic cirrhosis.  No focal mass
lesions.

Spleen:  Splenomegaly.

Gallbladder:  Cholecystectomy.

Common bile duct:  Massive dilation of the common bile duct remains
present, but unchanged compared to prior.  Common bile duct
measures 31 mm on axial imaging which is the same measurements as
on the prior exam.  The dilation extends to the ampulla.

Pancreas:  Normal.  Minimally prominent pancreatic duct.

Adrenal glands:  Normal.

Kidneys:  Normal enhancement.  Normal delayed excretion of
contrast.  Both ureters appear normal.  No mass lesions.  Unchanged
tortuosity of the proximal right ureter.

Stomach:  Normal.

Small bowel:  Distended duodenal bulb.  No obstruction.  There is
flattening of the duodenum at the level of the superior mesenteric
artery, which can be associated with functional obstruction
"superior mesenteric artery syndrome".  Distal small bowel is well
opacified with contrast.  There is no obstruction.

Colon:   Normal appendix. Ascending colon appears normal.  Normal
colonic opacification with contrast.  Transverse and descending
colon appear normal.  Rectosigmoid normal.

Pelvic Genitourinary:  Physiologic appearance of the uterus and
adnexa.  No free fluid.  Urinary bladder normal.

Bones:  No aggressive osseous lesions.  Lumbar scoliosis and
spondylosis.

Vasculature: Aortic atherosclerosis extending into the iliac
arteries.  No acute vascular abnormality.
IMPRESSION: 1.  No acute abnormality.
2.  Duodenal distention extending to the fourth part of the
duodenum which can be associated with SMA syndrome and relative
functional obstruction.
3.  Cholecystectomy with chronic massive dilation of the common
bile duct.  Suspect that this represents a choledochal cyst.  Mild
intrahepatic biliary ductal dilation.  Correlation with bilirubin
recommended.

## 2014-06-02 DEATH — deceased
